# Patient Record
Sex: Male | Born: 1960 | Race: White | Hispanic: No | Marital: Single | Smoking: Never smoker
Health system: Southern US, Community
[De-identification: ages and names within clinical notes are randomized; demographics above are authoritative.]

## PROBLEM LIST (undated history)

## (undated) DIAGNOSIS — I519 Heart disease, unspecified: Secondary | ICD-10-CM

## (undated) DIAGNOSIS — E119 Type 2 diabetes mellitus without complications: Secondary | ICD-10-CM

## (undated) DIAGNOSIS — G4730 Sleep apnea, unspecified: Secondary | ICD-10-CM

## (undated) DIAGNOSIS — I2510 Atherosclerotic heart disease of native coronary artery without angina pectoris: Secondary | ICD-10-CM

## (undated) DIAGNOSIS — I219 Acute myocardial infarction, unspecified: Secondary | ICD-10-CM

## (undated) HISTORY — PX: KNEE ARTHROSCOPY W/ MENISCAL REPAIR: SHX1877

## (undated) HISTORY — PX: BICEPS TENDON REPAIR: SHX566

## (undated) HISTORY — DX: Sleep apnea, unspecified: G47.30

## (undated) HISTORY — DX: Heart disease, unspecified: I51.9

## (undated) HISTORY — PX: STENT PLACEMENT VASCULAR (ARMC HX): HXRAD1737

## (undated) HISTORY — DX: Type 2 diabetes mellitus without complications: E11.9

## (undated) HISTORY — PX: TRICEPS TENDON REPAIR: SHX2577

## (undated) HISTORY — PX: CORONARY ANGIOPLASTY: SHX604

---
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
## 1998-02-26 ENCOUNTER — Ambulatory Visit (HOSPITAL_BASED_OUTPATIENT_CLINIC_OR_DEPARTMENT_OTHER): Admission: RE | Admit: 1998-02-26 | Discharge: 1998-02-26 | Payer: Self-pay | Admitting: Orthopedic Surgery

## 2002-10-29 ENCOUNTER — Emergency Department (HOSPITAL_COMMUNITY): Admission: EM | Admit: 2002-10-29 | Discharge: 2002-10-29 | Payer: Self-pay

## 2004-06-25 ENCOUNTER — Ambulatory Visit: Payer: Self-pay | Admitting: Internal Medicine

## 2004-07-26 ENCOUNTER — Ambulatory Visit: Payer: Self-pay | Admitting: Internal Medicine

## 2004-08-09 ENCOUNTER — Inpatient Hospital Stay: Payer: Self-pay | Admitting: Internal Medicine

## 2004-08-25 ENCOUNTER — Ambulatory Visit: Payer: Self-pay | Admitting: Internal Medicine

## 2004-09-25 ENCOUNTER — Ambulatory Visit: Payer: Self-pay | Admitting: Internal Medicine

## 2004-10-15 ENCOUNTER — Emergency Department: Payer: Self-pay | Admitting: Emergency Medicine

## 2004-11-17 ENCOUNTER — Ambulatory Visit: Payer: Self-pay | Admitting: Internal Medicine

## 2004-11-23 ENCOUNTER — Ambulatory Visit: Payer: Self-pay | Admitting: Internal Medicine

## 2004-12-24 ENCOUNTER — Ambulatory Visit: Payer: Self-pay | Admitting: Internal Medicine

## 2005-01-23 ENCOUNTER — Ambulatory Visit: Payer: Self-pay | Admitting: Internal Medicine

## 2005-02-23 ENCOUNTER — Ambulatory Visit: Payer: Self-pay | Admitting: Internal Medicine

## 2005-03-03 ENCOUNTER — Ambulatory Visit: Payer: Self-pay | Admitting: Internal Medicine

## 2005-03-16 ENCOUNTER — Ambulatory Visit: Payer: Self-pay | Admitting: Internal Medicine

## 2006-05-04 ENCOUNTER — Ambulatory Visit: Payer: Self-pay | Admitting: General Surgery

## 2006-07-30 ENCOUNTER — Ambulatory Visit: Payer: Self-pay | Admitting: Internal Medicine

## 2006-08-25 ENCOUNTER — Ambulatory Visit: Payer: Self-pay | Admitting: Internal Medicine

## 2006-08-31 ENCOUNTER — Ambulatory Visit: Payer: Self-pay | Admitting: Internal Medicine

## 2007-03-26 ENCOUNTER — Ambulatory Visit: Payer: Self-pay | Admitting: Internal Medicine

## 2007-04-26 ENCOUNTER — Ambulatory Visit: Payer: Self-pay | Admitting: Internal Medicine

## 2007-06-26 ENCOUNTER — Ambulatory Visit: Payer: Self-pay | Admitting: Internal Medicine

## 2007-07-16 ENCOUNTER — Ambulatory Visit: Payer: Self-pay | Admitting: Internal Medicine

## 2007-07-27 ENCOUNTER — Ambulatory Visit: Payer: Self-pay | Admitting: Internal Medicine

## 2007-12-12 ENCOUNTER — Ambulatory Visit: Payer: Self-pay | Admitting: Internal Medicine

## 2007-12-19 ENCOUNTER — Ambulatory Visit: Payer: Self-pay | Admitting: Internal Medicine

## 2008-06-22 ENCOUNTER — Emergency Department (HOSPITAL_BASED_OUTPATIENT_CLINIC_OR_DEPARTMENT_OTHER): Admission: EM | Admit: 2008-06-22 | Discharge: 2008-06-22 | Payer: Self-pay | Admitting: Emergency Medicine

## 2009-01-07 ENCOUNTER — Ambulatory Visit: Payer: Self-pay | Admitting: Surgery

## 2009-01-11 ENCOUNTER — Ambulatory Visit: Payer: Self-pay | Admitting: Surgery

## 2009-04-16 ENCOUNTER — Ambulatory Visit: Payer: Self-pay | Admitting: Cardiology

## 2009-09-14 ENCOUNTER — Ambulatory Visit: Payer: Self-pay | Admitting: Cardiology

## 2010-01-31 ENCOUNTER — Ambulatory Visit: Payer: Self-pay | Admitting: Internal Medicine

## 2010-04-07 ENCOUNTER — Ambulatory Visit: Payer: Self-pay | Admitting: Specialist

## 2010-07-15 ENCOUNTER — Ambulatory Visit: Payer: Self-pay

## 2010-08-05 ENCOUNTER — Ambulatory Visit: Payer: Self-pay | Admitting: Unknown Physician Specialty

## 2010-08-12 ENCOUNTER — Ambulatory Visit: Payer: Self-pay | Admitting: Unknown Physician Specialty

## 2010-11-11 ENCOUNTER — Ambulatory Visit: Payer: Self-pay | Admitting: Cardiology

## 2011-06-26 LAB — GLUCOSE, CAPILLARY: Glucose-Capillary: 169 — ABNORMAL HIGH

## 2011-11-17 ENCOUNTER — Ambulatory Visit: Payer: Self-pay | Admitting: Unknown Physician Specialty

## 2012-02-02 ENCOUNTER — Ambulatory Visit: Payer: Self-pay | Admitting: Internal Medicine

## 2012-11-09 ENCOUNTER — Ambulatory Visit: Payer: Self-pay | Admitting: Unknown Physician Specialty

## 2013-01-03 ENCOUNTER — Ambulatory Visit: Payer: Self-pay | Admitting: Family Medicine

## 2013-08-18 LAB — CBC WITH DIFFERENTIAL/PLATELET
Basophil #: 0.1 10*3/uL (ref 0.0–0.1)
Basophil %: 1.3 %
Eosinophil #: 0.2 10*3/uL (ref 0.0–0.7)
HCT: 47.5 % (ref 40.0–52.0)
HGB: 16.4 g/dL (ref 13.0–18.0)
MCH: 31.3 pg (ref 26.0–34.0)
MCV: 91 fL (ref 80–100)
Monocyte %: 7.2 %
Neutrophil #: 7.3 10*3/uL — ABNORMAL HIGH (ref 1.4–6.5)
Neutrophil %: 68.4 %
WBC: 10.7 10*3/uL — ABNORMAL HIGH (ref 3.8–10.6)

## 2013-08-18 LAB — BASIC METABOLIC PANEL
BUN: 22 mg/dL — ABNORMAL HIGH (ref 7–18)
Co2: 22 mmol/L (ref 21–32)
Creatinine: 0.66 mg/dL (ref 0.60–1.30)
Sodium: 139 mmol/L (ref 136–145)

## 2013-08-18 LAB — TROPONIN I: Troponin-I: 0.1 ng/mL — ABNORMAL HIGH

## 2013-08-18 LAB — APTT: Activated PTT: 31.5 secs (ref 23.6–35.9)

## 2013-08-19 ENCOUNTER — Inpatient Hospital Stay: Payer: Self-pay | Admitting: Cardiology

## 2013-08-19 LAB — APTT: Activated PTT: 85.1 secs — ABNORMAL HIGH (ref 23.6–35.9)

## 2013-08-19 LAB — CK TOTAL AND CKMB (NOT AT ARMC)
CK, Total: 100 U/L (ref 35–232)
CK-MB: 9.9 ng/mL — ABNORMAL HIGH (ref 0.5–3.6)

## 2013-08-20 LAB — BASIC METABOLIC PANEL
Calcium, Total: 8.7 mg/dL (ref 8.5–10.1)
Co2: 24 mmol/L (ref 21–32)
Creatinine: 0.71 mg/dL (ref 0.60–1.30)
EGFR (African American): >60
Glucose: 111 mg/dL — ABNORMAL HIGH (ref 65–99)
Potassium: 4.1 mmol/L (ref 3.5–5.1)
Sodium: 138 mmol/L (ref 136–145)

## 2013-08-20 LAB — APTT: Activated PTT: 30.8 secs (ref 23.6–35.9)

## 2014-08-11 ENCOUNTER — Observation Stay: Payer: Self-pay | Admitting: Cardiology

## 2014-08-11 DIAGNOSIS — I2510 Atherosclerotic heart disease of native coronary artery without angina pectoris: Secondary | ICD-10-CM | POA: Insufficient documentation

## 2014-08-11 DIAGNOSIS — F419 Anxiety disorder, unspecified: Secondary | ICD-10-CM | POA: Insufficient documentation

## 2014-08-11 LAB — TROPONIN I: Troponin-I: <0.02 ng/mL

## 2014-08-11 LAB — CBC WITH DIFFERENTIAL/PLATELET
BASOS ABS: 0.1 10*3/uL (ref 0.0–0.1)
BASOS PCT: 0.9 %
Eosinophil #: 0.1 10*3/uL (ref 0.0–0.7)
Eosinophil %: 1.2 %
HCT: 49.8 % (ref 40.0–52.0)
HGB: 16.4 g/dL (ref 13.0–18.0)
Lymphocyte #: 2.3 10*3/uL (ref 1.0–3.6)
Lymphocyte %: 23.1 %
MCH: 30.7 pg (ref 26.0–34.0)
MCHC: 33 g/dL (ref 32.0–36.0)
MCV: 93 fL (ref 80–100)
Monocyte #: 0.6 x10 3/mm (ref 0.2–1.0)
Monocyte %: 6.3 %
NEUTROS ABS: 6.9 10*3/uL — AB (ref 1.4–6.5)
Neutrophil %: 68.5 %
Platelet: 249 10*3/uL (ref 150–440)
RBC: 5.34 10*6/uL (ref 4.40–5.90)
RDW: 13.4 % (ref 11.5–14.5)
WBC: 10.1 10*3/uL (ref 3.8–10.6)

## 2014-08-11 LAB — BASIC METABOLIC PANEL
Anion Gap: 5 — ABNORMAL LOW (ref 7–16)
BUN: 20 mg/dL — AB (ref 7–18)
Calcium, Total: 8.8 mg/dL (ref 8.5–10.1)
Chloride: 105 mmol/L (ref 98–107)
Co2: 26 mmol/L (ref 21–32)
Creatinine: 0.76 mg/dL (ref 0.60–1.30)
EGFR (African American): >60
EGFR (Non-African Amer.): >60
GLUCOSE: 194 mg/dL — AB (ref 65–99)
Osmolality: 280 (ref 275–301)
POTASSIUM: 3.8 mmol/L (ref 3.5–5.1)
Sodium: 136 mmol/L (ref 136–145)

## 2014-08-11 LAB — APTT: Activated PTT: 28.4 secs (ref 23.6–35.9)

## 2014-08-11 LAB — PROTIME-INR
INR: 1
Prothrombin Time: 13.1 secs (ref 11.5–14.7)

## 2014-08-11 LAB — HEPARIN LEVEL (UNFRACTIONATED): ANTI-XA(UNFRACTIONATED): 0.23 [IU]/mL — AB (ref 0.30–0.70)

## 2014-08-12 LAB — CBC WITH DIFFERENTIAL/PLATELET
BASOS ABS: 0.1 10*3/uL (ref 0.0–0.1)
BASOS PCT: 1.3 %
EOS ABS: 0.2 10*3/uL (ref 0.0–0.7)
Eosinophil %: 2.2 %
HCT: 47 % (ref 40.0–52.0)
HGB: 15.4 g/dL (ref 13.0–18.0)
Lymphocyte #: 2.2 10*3/uL (ref 1.0–3.6)
Lymphocyte %: 22.6 %
MCH: 30.8 pg (ref 26.0–34.0)
MCHC: 32.8 g/dL (ref 32.0–36.0)
MCV: 94 fL (ref 80–100)
MONO ABS: 0.5 x10 3/mm (ref 0.2–1.0)
Monocyte %: 5.6 %
Neutrophil #: 6.5 10*3/uL (ref 1.4–6.5)
Neutrophil %: 68.3 %
Platelet: 234 10*3/uL (ref 150–440)
RBC: 5.02 10*6/uL (ref 4.40–5.90)
RDW: 13.6 % (ref 11.5–14.5)
WBC: 9.5 10*3/uL (ref 3.8–10.6)

## 2014-08-12 LAB — BASIC METABOLIC PANEL
ANION GAP: 5 — AB (ref 7–16)
BUN: 21 mg/dL — ABNORMAL HIGH (ref 7–18)
CALCIUM: 8.3 mg/dL — AB (ref 8.5–10.1)
CHLORIDE: 110 mmol/L — AB (ref 98–107)
Co2: 24 mmol/L (ref 21–32)
Creatinine: 0.69 mg/dL (ref 0.60–1.30)
EGFR (Non-African Amer.): >60
GLUCOSE: 181 mg/dL — AB (ref 65–99)
Osmolality: 285 (ref 275–301)
Potassium: 4.2 mmol/L (ref 3.5–5.1)
Sodium: 139 mmol/L (ref 136–145)

## 2014-08-12 LAB — HEPARIN LEVEL (UNFRACTIONATED): ANTI-XA(UNFRACTIONATED): 0.42 [IU]/mL (ref 0.30–0.70)

## 2015-01-15 NOTE — Discharge Summary (Signed)
Dates of Admission and Diagnosis:  Date of Admission 18-Aug-2013   Date of Discharge 20-Aug-2013   Admitting Diagnosis unstable angina   Final Diagnosis non st elevation mi   Discharge Diagnosis 1 non st elevation mi   2 diabetes melitus   3 hyperlipidemia   4 acute coronary syndrome    Chief Complaint/History of Present Illness chest pain at rest similar to his angina. ruled in for nstemi with troponin of 0.14. 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 Less than 10% stenosis in proximal rca at previous stent, less than 10% stenosis in left circumflex; less than 10% stenosis in proximal lad at previous stent site. EF 50%. Underwent pci of distal rca lesion which had timi 3 flow.   Hospital Course:  Hospital Course Has done well post pci. No evidence of complications form cath . Renal funciton unchanged. No chest pain. Ambulating well. Tolerated plavix and beta blockers as well as ace i and statin and asa   Condition on Discharge Good   DISCHARGE INSTRUCTIONS HOME MEDS:  Medication Reconciliation: Patient's Home Medications at Discharge:     Medication Instructions  crestor 10 mg tablet  1 tab(s) orally once a day (at bedtime) x 30 days    nitrostat 0.4 mg tablet  1 tab(s) sublingual every 5 minutes    advil 200 mg tablet  2 tab(s) orally every 4 hours    lisinopril 5 mg oral tablet  1 tab(s) orally once a day   onglyza 5 mg oral tablet  1 tab(s) orally once a day   meloxicam 15 mg oral tablet  1 tab(s) orally once a day   farxiga 10 mg oral tablet  1 tab(s) orally once a day   lantus  15  subcutaneous 2 times a day (before meals)   actos 45 mg oral tablet  1 tab(s) orally once a day   novolog 100 units/ml subcutaneous solution  12  subcutaneous once (at bedtime)   clopidogrel 75 mg oral tablet  1 tab(s) orally once a day   clopidogrel 75 mg oral tablet  1 tab(s) orally once a day   metoprolol succinate 25 mg oral  tablet, extended release  1 tab(s) orally once a day    PRESCRIPTIONS: PRINTED AND GIVEN TO PATIENT/FAMILY   Physician's Instructions:  Home Health? No   Treatments None   Dressing Care Remove dressing tomorrow.  May shower.   Home Oxygen? No   Diet Low Fat, Low Cholesterol   Dietary Supplements None   Diet Consistency Regular Consistency   Activity Limitations No exertional activity  No heavy lifting   Return to Work after follow up visit with MD   Time frame for Follow Up Appointment 1-2 weeks   Electronic Signatures: Teodoro Spray (MD)  (Signed [phone] 08:19)  Authored: ADMISSION DATE AND DIAGNOSIS, CHIEF COMPLAINT/HPI, HOSPITAL COURSE, DISCHARGE INSTRUCTIONS HOME MEDS, PATIENT INSTRUCTIONS   Last Updated: 26-Nov-14 08:19 by Teodoro Spray (MD)

## 2016-06-05 ENCOUNTER — Ambulatory Visit: Payer: Self-pay

## 2017-01-16 ENCOUNTER — Ambulatory Visit: Payer: Self-pay | Admitting: Oncology

## 2017-02-15 ENCOUNTER — Other Ambulatory Visit: Payer: Self-pay | Admitting: *Deleted

## 2017-02-15 ENCOUNTER — Inpatient Hospital Stay: Payer: BLUE CROSS/BLUE SHIELD

## 2017-02-15 ENCOUNTER — Encounter (INDEPENDENT_AMBULATORY_CARE_PROVIDER_SITE_OTHER): Payer: Self-pay

## 2017-02-15 ENCOUNTER — Inpatient Hospital Stay: Payer: BLUE CROSS/BLUE SHIELD | Attending: Oncology | Admitting: Oncology

## 2017-02-15 ENCOUNTER — Encounter: Payer: Self-pay | Admitting: Oncology

## 2017-02-15 VITALS — BP 129/77 | HR 79 | Temp 97.5°F | Wt 199.7 lb

## 2017-02-15 VITALS — BP 116/69 | HR 65 | Resp 20

## 2017-02-15 DIAGNOSIS — R634 Abnormal weight loss: Secondary | ICD-10-CM | POA: Diagnosis not present

## 2017-02-15 DIAGNOSIS — F419 Anxiety disorder, unspecified: Secondary | ICD-10-CM | POA: Insufficient documentation

## 2017-02-15 DIAGNOSIS — E119 Type 2 diabetes mellitus without complications: Secondary | ICD-10-CM | POA: Insufficient documentation

## 2017-02-15 DIAGNOSIS — M1990 Unspecified osteoarthritis, unspecified site: Secondary | ICD-10-CM | POA: Diagnosis not present

## 2017-02-15 DIAGNOSIS — E291 Testicular hypofunction: Secondary | ICD-10-CM | POA: Diagnosis not present

## 2017-02-15 DIAGNOSIS — R5381 Other malaise: Secondary | ICD-10-CM | POA: Diagnosis not present

## 2017-02-15 DIAGNOSIS — D751 Secondary polycythemia: Secondary | ICD-10-CM

## 2017-02-15 DIAGNOSIS — Z563 Stressful work schedule: Secondary | ICD-10-CM | POA: Diagnosis not present

## 2017-02-15 DIAGNOSIS — Z87891 Personal history of nicotine dependence: Secondary | ICD-10-CM | POA: Diagnosis not present

## 2017-02-15 DIAGNOSIS — R5383 Other fatigue: Secondary | ICD-10-CM | POA: Insufficient documentation

## 2017-02-15 DIAGNOSIS — Z79899 Other long term (current) drug therapy: Secondary | ICD-10-CM

## 2017-02-15 DIAGNOSIS — I2510 Atherosclerotic heart disease of native coronary artery without angina pectoris: Secondary | ICD-10-CM | POA: Diagnosis not present

## 2017-02-15 DIAGNOSIS — D45 Polycythemia vera: Secondary | ICD-10-CM

## 2017-02-15 LAB — CBC WITH DIFFERENTIAL/PLATELET
BASOS PCT: 1 %
Basophils Absolute: 0.1 10*3/uL (ref 0–0.1)
EOS ABS: 0.6 10*3/uL (ref 0–0.7)
Eosinophils Relative: 7 %
HEMATOCRIT: 56.9 % — AB (ref 40.0–52.0)
HEMOGLOBIN: 19.9 g/dL — AB (ref 13.0–18.0)
Lymphocytes Relative: 25 %
Lymphs Abs: 2.1 10*3/uL (ref 1.0–3.6)
MCH: 31.8 pg (ref 26.0–34.0)
MCHC: 34.9 g/dL (ref 32.0–36.0)
MCV: 91.2 fL (ref 80.0–100.0)
Monocytes Absolute: 0.7 10*3/uL (ref 0.2–1.0)
Monocytes Relative: 9 %
NEUTROS ABS: 4.9 10*3/uL (ref 1.4–6.5)
NEUTROS PCT: 58 %
Platelets: 223 10*3/uL (ref 150–440)
RBC: 6.24 MIL/uL — AB (ref 4.40–5.90)
RDW: 14 % (ref 11.5–14.5)
WBC: 8.4 10*3/uL (ref 3.8–10.6)

## 2017-02-15 LAB — URINALYSIS, COMPLETE (UACMP) WITH MICROSCOPIC
BACTERIA UA: NONE SEEN
Bilirubin Urine: NEGATIVE
Glucose, UA: >=500 mg/dL — AB
Hgb urine dipstick: NEGATIVE
Ketones, ur: 5 mg/dL — AB
LEUKOCYTES UA: NEGATIVE
NITRITE: NEGATIVE
PH: 6 (ref 5.0–8.0)
Protein, ur: NEGATIVE mg/dL
RBC / HPF: NONE SEEN RBC/hpf (ref 0–5)
SPECIFIC GRAVITY, URINE: 1.017 (ref 1.005–1.030)
Squamous Epithelial / LPF: NONE SEEN

## 2017-02-15 NOTE — Patient Instructions (Signed)
Therapeutic Phlebotomy Discharge Instructions  - Increase your fluid intake over the next 4 hours  - No smoking for 30 minutes  - Avoid using the affected arm (the one you had the blood drawn from) for heavy lifting or other activities.  - You may resume all normal activities after 30 minutes.  You are to notify the office if you experience:   - Persistent dizziness and/or lightheadedness -Uncontrolled or excessive bleeding at the site.    Therapeutic Phlebotomy Therapeutic phlebotomy is the controlled removal of blood from a person's body for the purpose of treating a medical condition. The procedure is similar to donating blood. Usually, about a pint (470 mL, or 0.47L) of blood is removed. The average adult has 9-12 pints (4.3-5.7 L) of blood. Therapeutic phlebotomy may be used to treat the following medical conditions:  Hemochromatosis. This is a condition in which the blood contains too much iron.  Polycythemia vera. This is a condition in which the blood contains too many red blood cells.  Porphyria cutanea tarda. This is a disease in which an important part of hemoglobin is not made properly. It results in the buildup of abnormal amounts of porphyrins in the body.  Sickle cell disease. This is a condition in which the red blood cells form an abnormal crescent shape rather than a round shape. Tell a health care provider about:  Any allergies you have.  All medicines you are taking, including vitamins, herbs, eye drops, creams, and over-the-counter medicines.  Any problems you or family members have had with anesthetic medicines.  Any blood disorders you have.  Any surgeries you have had.  Any medical conditions you have. What are the risks? Generally, this is a safe procedure. However, problems may occur, including:  Nausea or light-headedness.  Low blood pressure.  Soreness, bleeding, swelling, or bruising at the needle insertion site.  Infection. What happens  before the procedure?  Follow instructions from your health care provider about eating or drinking restrictions.  Ask your health care provider about changing or stopping your regular medicines. This is especially important if you are taking diabetes medicines or blood thinners.  Wear clothing with sleeves that can be raised above the elbow.  Plan to have someone take you home after the procedure.  You may have a blood sample taken. What happens during the procedure?  A needle will be inserted into one of your veins.  Tubing and a collection bag will be attached to that needle.  Blood will flow through the needle and tubing into the collection bag.  You may be asked to open and close your hand slowly and continually during the entire collection.  After the specified amount of blood has been removed from your body, the collection bag and tubing will be clamped.  The needle will be removed from your vein.  Pressure will be held on the site of the needle insertion to stop the bleeding.  A bandage (dressing) will be placed over the needle insertion site. The procedure may vary among health care providers and hospitals. What happens after the procedure?  Your recovery will be assessed and monitored.  You can return to your normal activities as directed by your health care provider. This information is not intended to replace advice given to you by your health care provider. Make sure you discuss any questions you have with your health care provider. Document Released: 02/13/2011 Document Revised: 05/13/2016 Document Reviewed: 09/07/2014 Elsevier Interactive Patient Education  2017 [employer].  Therapeutic Phlebotomy, Care After Refer to this sheet in the next few weeks. These instructions provide you with information about caring for yourself after your procedure. Your health care provider may also give you more specific instructions. Your treatment has been planned according to  current medical practices, but problems sometimes occur. Call your health care provider if you have any problems or questions after your procedure. What can I expect after the procedure? After the procedure, it is common to have:  Light-headedness or dizziness. You may feel faint.  Nausea.  Tiredness. Follow these instructions at home: Activity   Return to your normal activities as directed by your health care provider. Most people can go back to their normal activities right away.  Avoid strenuous physical activity and heavy lifting or pulling for about 5 hours after the procedure. Do not lift anything that is heavier than 10 lb (4.5 kg).  Athletes should avoid strenuous exercise for at least 12 hours.  Change positions slowly for the remainder of the day. This will help to prevent light-headedness or fainting.  If you feel light-headed, lie down until the feeling goes away. Eating and drinking   Be sure to eat well-balanced meals for the next 24 hours.  Drink enough fluid to keep your urine clear or pale yellow.  Avoid drinking alcohol on the day that you had the procedure. Care of the Needle Insertion Site   Keep your bandage dry. You can remove the bandage after about 5 hours or as directed by your health care provider.  If you have bleeding from the needle insertion site, elevate your arm and press firmly on the site until the bleeding stops.  If you have bruising at the site, apply ice to the area:  Put ice in a plastic bag.  Place a towel between your skin and the bag.  Leave the ice on for 20 minutes, 2-3 times a day for the first 24 hours.  If the swelling does not go away after 24 hours, apply a warm, moist washcloth to the area for 20 minutes, 2-3 times a day. General instructions   Avoid smoking for at least 30 minutes after the procedure.  Keep all follow-up visits as directed by your health care provider. It is important to continue with further  therapeutic phlebotomy treatments as directed. Contact a health care provider if:  You have redness, swelling, or pain at the needle insertion site.  You have fluid, blood, or pus coming from the needle insertion site.  You feel light-headed, dizzy, or nauseated, and the feeling does not go away.  You notice new bruising at the needle insertion site.  You feel weaker than normal.  You have a fever or chills. Get help right away if:  You have severe nausea or vomiting.  You have chest pain.  You have trouble breathing. This information is not intended to replace advice given to you by your health care provider. Make sure you discuss any questions you have with your health care provider. Document Released: 02/13/2011 Document Revised: 05/13/2016 Document Reviewed: 09/07/2014 Elsevier Interactive Patient Education  2017 [employer].

## 2017-02-16 ENCOUNTER — Encounter: Payer: Self-pay | Admitting: Oncology

## 2017-02-16 NOTE — Progress Notes (Signed)
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 Erlene Quan back in 2008 for polycythemia. I do not have those records at this time. He has been referred to [place] for polycythemia. Recent CBC from 12/14/2016 showed white count of 10.9, H&H of 19.8/excision on with a platelet count of 207. CMP was significant for a blood sugar of 188. TSH was within normal limits. Total testosterone was significantly elevated at 1176.  Trend of his cbc is as follows:  Component     Latest Ref Rng & Units 08/18/2013 08/11/2014 08/12/2014 02/15/2017  WBC     3.8 - 10.6 K/uL 10.7 (H) 10.1 9.5 8.4  RBC     4.40 - 5.90 MIL/uL 5.22 5.34 5.02 6.24 (H)  HGB     13.0 - 18.0 g/dL 16.4 16.4 15.4   HCT     40.0 - 52.0 % 47.5 49.8 47.0   MCV     80.0 - 100.0 fL 91 93 94 91.2  MCH     26.0 - 34.0 pg 31.3 30.7 30.8 31.8  MCHC     32.0 - 36.0 g/dL 34.4 33.0 32.8 34.9  RDW     11.5 - 14.5 % 13.7 13.4 13.6 14.0  Platelets     150 - 440 K/uL 241 249 234 223  Neutrophils     % 68.4 68.5 68.3 58  Lymphocytes     % 21.5 23.1 22.6 25  Monocytes Relative     % 7.2 6.3 5.6 9  Eosinophil     % 1.6 1.2 2.2 7  Basophil     % 1.3 0.9 1.3 1  NEUT#     1.4 - 6.5 K/uL 7.3 (H) 6.9 (H) 6.5 4.9  Lymphocyte #     1.0 - 3.6 K/uL 2.3 2.3 2.2 2.1  Monocyte #     0.2 - 1.0 K/uL 0.8 0.6 0.5 0.7  Eosinophils Absolute     0 - 0.7 K/uL 0.2 0.1 0.2 0.6  Basophils Absolute     0 - 0.1 K/uL 0.1 0.1 0.1 0.1  Hemoglobin     13.0 - 18.0 g/dL    19.9 (H)  HCT     40.0 - 52.0 %    56.9 (H)    Patient states that  he has possible hypogonadism (although not formally diagnosed) and report significant fatigue when he is not on testosterone. He has never seen a urologist for the same.  he has been injecting himself testosterone shots mainly for his fatigue and building muscle. Denies any headaches or lightheadedness chest pain or strokelike symptoms. He has had coronary disease in the past and has had stent placements as well. Admits some unintentional weight loss but also reports being under stress at work. He thinks he has elevated H/H even when he was not on testosterone supplements. He has h/o OSA. Has smoked occasional cigars in the past. No known chronic lung disease  ECOG PS-  0  Pain scale- 0   Review of systems- Review of Systems  Constitutional: Positive for malaise/fatigue. Negative for chills, fever and weight loss.  HENT: Negative for congestion, ear discharge and nosebleeds.   Eyes: Negative for blurred vision.  Respiratory: Negative for cough, hemoptysis, sputum production, shortness of breath and wheezing.   Cardiovascular: Negative for chest pain, palpitations, orthopnea and claudication.  Gastrointestinal: Negative for abdominal pain, blood in stool, constipation, diarrhea, heartburn, melena, nausea and vomiting.  Genitourinary: Negative for dysuria, flank pain, frequency, hematuria and urgency.  Musculoskeletal: Negative for back pain, joint pain and myalgias.  Skin: Negative for rash.  Neurological: Negative for dizziness, tingling, focal weakness, seizures, weakness and headaches.  Endo/Heme/Allergies: Does not bruise/bleed easily.  Psychiatric/Behavioral: Negative for depression and suicidal ideas. The patient does not have insomnia.     Not on File  Patient Active Problem List   Diagnosis Date Noted  . 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 Anxiety 08/11/2014  . Coronary artery disease 08/11/2014     Past Medical History:  Diagnosis Date  . Diabetes mellitus without  complication ([place])   . Heart disease   . Sleep apnea in adult      Past Surgical History:  Procedure Laterality Date  . STENT PLACEMENT VASCULAR ([place] HX)      Social History   Social History  . Marital status: Married    Spouse name: N/A  . Number of children: N/A  . Years of education: N/A   Occupational History  . Not on file.   Social History Main Topics  . Smoking status: Not on file  . Smokeless tobacco: Not on file  . Alcohol use Not on file  . Drug use: Unknown  . Sexual activity: Not on file   Other Topics Concern  . Not on file   Social History Narrative  . No narrative on file     No family history on file.   Current Outpatient Prescriptions:  .  REPATHA SURECLICK 469 MG/ML SOAJ, 140 mg., Disp: , Rfl:    Physical exam:  Vitals:   02/15/17 1511  BP: 129/77  Pulse: 79  Temp: 97.5 F (36.4 C)  Weight: 199 lb 11.2 oz (90.6 kg)   Physical Exam  Constitutional: He is oriented to person, place, and time and well-developed, well-nourished, and in no distress.  General appearance is ruddy. Appears well built  HENT:  Head: Normocephalic and atraumatic.  Eyes: EOM are normal. Pupils are equal, round, and reactive to light.  Neck: Normal range of motion.  Cardiovascular: Normal rate, regular rhythm and normal heart sounds.   Pulmonary/Chest: Effort normal and breath sounds normal.  Abdominal: Soft. Bowel sounds are normal.  Neurological: He is alert and oriented to person, place, and time.  Skin: Skin is warm and dry.       CMP Latest Ref Rng & Units 08/12/2014  Glucose 65 - 99 mg/dL 181(H)  BUN 7 - 18 mg/dL 21(H)  Creatinine 0.60 - 1.30 mg/dL 0.69  Sodium 136 - 145 mmol/L 139  Potassium 3.5 - 5.1 mmol/L 4.2  Chloride 98 - 107 mmol/L 110(H)  CO2 21 - 32 mmol/L 24  Calcium 8.5 - 10.1 mg/dL 8.3(L)   CBC Latest Ref Rng & Units 02/15/2017  WBC 3.8 - 10.6 K/uL 8.4  Hemoglobin 13.0 - 18.0 g/dL 19.9(H)  Hematocrit 40.0 - 52.0 % 56.9(H)    Platelets 150 - 440 K/uL 223    Assessment and plan- Patient is a 56 y.o. 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 I have strongly recommended that patient should stop injecting himself testosterone immediately. At such high levels of H&H patient is at increased risk of strokes and heart attacks. I would not recommend continuing testosterone use and phlebotomy to decrease his H&H. He needs to be formally evaluated by urology for hypogonadism and if he does have it, lowest dose of testosterone should be initiated to alleviate his symptoms. If H/H remains elevated despite low does testosterone and if he is unable to come off testosterone due to symtpoms then intermittent phlebotomy could be considered.  I will check JAK2 mutation and UA given that patient states he has had elevated H/H despite not using testosterone. although levels from 2014 and 2015 show that his hb has been <16.5. His OSA could also be partly contributing to his polycythemia. I will plan for phlebotomy 250 cc Q2 weeks X 2. I will see him back in 1 month. I will also speak to Threasa Alpha FNP regarding his self testosterone use which I strongly discourage. I will also see his prior work up from 2008 by Dr. Ma Hillock   Thank you for this kind referral and the opportunity to participate in the care of this patient   Visit Diagnosis 1. Polycythemia, secondary     Dr. Randa Evens, MD, MPH [facility] Pager- [phone] 02/16/2017

## 2017-02-23 LAB — JAK2 EXONS 12-15

## 2017-02-23 LAB — JAK2  V617F QUAL. WITH REFLEX TO EXON 12: Reflex:: 15

## 2017-03-01 ENCOUNTER — Other Ambulatory Visit: Payer: BLUE CROSS/BLUE SHIELD

## 2017-03-07 ENCOUNTER — Ambulatory Visit
Admission: RE | Admit: 2017-03-07 | Discharge: 2017-03-07 | Disposition: A | Discharge status: Home / Self Care | Payer: BLUE CROSS/BLUE SHIELD | Source: Ambulatory Visit | Attending: Oncology | Admitting: Oncology

## 2017-03-07 DIAGNOSIS — D751 Secondary polycythemia: Secondary | ICD-10-CM | POA: Insufficient documentation

## 2017-03-09 ENCOUNTER — Inpatient Hospital Stay: Payer: BLUE CROSS/BLUE SHIELD | Attending: Oncology

## 2017-03-09 ENCOUNTER — Other Ambulatory Visit: Payer: Self-pay

## 2017-03-09 ENCOUNTER — Inpatient Hospital Stay: Payer: BLUE CROSS/BLUE SHIELD

## 2017-03-09 DIAGNOSIS — E119 Type 2 diabetes mellitus without complications: Secondary | ICD-10-CM | POA: Insufficient documentation

## 2017-03-09 DIAGNOSIS — E291 Testicular hypofunction: Secondary | ICD-10-CM | POA: Diagnosis not present

## 2017-03-09 DIAGNOSIS — R5383 Other fatigue: Secondary | ICD-10-CM | POA: Diagnosis not present

## 2017-03-09 DIAGNOSIS — D751 Secondary polycythemia: Secondary | ICD-10-CM | POA: Diagnosis present

## 2017-03-09 DIAGNOSIS — Z566 Other physical and mental strain related to work: Secondary | ICD-10-CM | POA: Insufficient documentation

## 2017-03-09 DIAGNOSIS — G4733 Obstructive sleep apnea (adult) (pediatric): Secondary | ICD-10-CM | POA: Diagnosis not present

## 2017-03-09 DIAGNOSIS — Z87891 Personal history of nicotine dependence: Secondary | ICD-10-CM | POA: Insufficient documentation

## 2017-03-09 DIAGNOSIS — Z79899 Other long term (current) drug therapy: Secondary | ICD-10-CM | POA: Diagnosis not present

## 2017-03-09 LAB — HEMOGLOBIN: Hemoglobin: 18.6 g/dL — ABNORMAL HIGH (ref 13.0–18.0)

## 2017-03-09 NOTE — Progress Notes (Signed)
PerDr Janese Banks proceed with phlebotomy today and will follow up in 2 weeks with pt.

## 2017-03-15 ENCOUNTER — Inpatient Hospital Stay: Payer: BLUE CROSS/BLUE SHIELD | Admitting: Oncology

## 2017-03-15 ENCOUNTER — Inpatient Hospital Stay: Payer: BLUE CROSS/BLUE SHIELD

## 2017-03-23 ENCOUNTER — Encounter: Payer: Self-pay | Admitting: Oncology

## 2017-03-23 ENCOUNTER — Inpatient Hospital Stay: Payer: BLUE CROSS/BLUE SHIELD

## 2017-03-23 ENCOUNTER — Inpatient Hospital Stay (HOSPITAL_BASED_OUTPATIENT_CLINIC_OR_DEPARTMENT_OTHER): Payer: BLUE CROSS/BLUE SHIELD | Admitting: Oncology

## 2017-03-23 VITALS — BP 131/77 | HR 73 | Temp 97.0°F | Resp 20

## 2017-03-23 VITALS — BP 121/79 | HR 66 | Temp 97.5°F | Resp 20 | Wt 194.3 lb

## 2017-03-23 DIAGNOSIS — E291 Testicular hypofunction: Secondary | ICD-10-CM | POA: Diagnosis not present

## 2017-03-23 DIAGNOSIS — D751 Secondary polycythemia: Secondary | ICD-10-CM

## 2017-03-23 DIAGNOSIS — Z566 Other physical and mental strain related to work: Secondary | ICD-10-CM

## 2017-03-23 DIAGNOSIS — E119 Type 2 diabetes mellitus without complications: Secondary | ICD-10-CM

## 2017-03-23 DIAGNOSIS — R5383 Other fatigue: Secondary | ICD-10-CM | POA: Diagnosis not present

## 2017-03-23 DIAGNOSIS — Z87891 Personal history of nicotine dependence: Secondary | ICD-10-CM | POA: Diagnosis not present

## 2017-03-23 DIAGNOSIS — Z79899 Other long term (current) drug therapy: Secondary | ICD-10-CM

## 2017-03-23 DIAGNOSIS — G4733 Obstructive sleep apnea (adult) (pediatric): Secondary | ICD-10-CM

## 2017-03-23 LAB — CBC
HEMATOCRIT: 50.8 % (ref 39.0–52.0)
HEMOGLOBIN: 18.1 g/dL — AB (ref 13.0–17.0)
MCH: 32.2 pg (ref 26.0–34.0)
MCHC: 35.6 g/dL (ref 32.0–36.0)
MCV: 90.3 fL (ref 80.0–100.0)
Platelets: 244 10*3/uL (ref 150–440)
RBC: 5.62 MIL/uL (ref 4.40–5.90)
RDW: 13.9 % (ref 11.5–14.5)
WBC: 9 10*3/uL (ref 3.8–10.6)

## 2017-03-23 NOTE — Progress Notes (Signed)
Hematology/Oncology Consult note 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 Erlene Quan back in 2008 for polycythemia. I do not have those records at this time. He has been referred to [place] for polycythemia. Recent CBC from 12/14/2016 showed white count of 10.9, H&H of 19.8/excision on with a platelet count of 207. CMP was significant for a blood sugar of 188. TSH was within normal limits. Total testosterone was significantly elevated at 1176. Patient noted to have a hemoglobin of 15-16 between 2014 in 2015 and was found to have a hemoglobin of 19.9 on 02/15/2017  Patient states that he has possible hypogonadism (although not formally diagnosed) and report significant fatigue when he is not on testosterone. He has never seen a urologist for the same.  he has been injecting himself testosterone shots mainly for his fatigue and building muscle. Denies any headaches or lightheadedness chest pain or strokelike symptoms. He has had coronary disease in the past and has had stent placements as well. Admits some unintentional weight loss but also reports being under stress at work. He thinks he has elevated H/H even when he was not on testosterone supplements. He has h/o OSA. Has smoked occasional cigars in the past. No known chronic lung disease  Further workup on 02/15/2017 revealed no evidence of jak 2 grays on 12 mutation. Urinalysis is negative for hematuria. Chest x-ray showed coarse interstitial markings. No acute disease  Interval history- patient states that he has stopped doing his testosterone shots since his last visit. He  refuses to tell me who has been prescribing him testosterone injections or how he has been getting testosterone to inject himself  ECOG PS- 0 Pain scale- 0  Review of systems- Review of Systems  Constitutional: Negative for chills, fever, malaise/fatigue and weight loss.  HENT: Negative for congestion, ear discharge and nosebleeds.   Eyes: Negative for blurred vision.  Respiratory: Negative for cough, hemoptysis, sputum production, shortness of breath and wheezing.   Cardiovascular: Negative for chest pain, palpitations, orthopnea and claudication.  Gastrointestinal: Negative for abdominal pain, blood in stool, constipation, diarrhea, heartburn, melena, nausea and vomiting.  Genitourinary: Negative for dysuria, flank pain, frequency, hematuria and urgency.  Musculoskeletal: Negative for back pain, joint pain and myalgias.  Skin: Negative for rash.  Neurological: Negative for dizziness, tingling, focal weakness, seizures, weakness and headaches.  Endo/Heme/Allergies: Does not bruise/bleed easily.  Psychiatric/Behavioral: Negative for depression and suicidal ideas. The patient does not have insomnia.       Not on File   Past Medical History:  Diagnosis Date  . Diabetes mellitus without complication ([place])   . Heart disease   . Sleep apnea in adult      Past Surgical History:  Procedure Laterality Date  . STENT PLACEMENT VASCULAR ([place] HX)      Social History   Social History  . Marital status: Married    Spouse name: N/A  . Number of children: N/A  . Years of education: N/A   Occupational History  . Not on file.   Social History Main Topics  . Smoking status: Former Smoker    Types: Cigars  . Smokeless  tobacco: Never Used     Comment: occasional cigars  . Alcohol use Not on file  . Drug use: Unknown  . Sexual activity: Not on file   Other Topics Concern  . Not on file   Social History Narrative  . No narrative on file    No family history on  file.   Current Outpatient Prescriptions:  .  REPATHA SURECLICK 629 MG/ML SOAJ, 140 mg., Disp: , Rfl:   Physical exam:  Vitals:   03/23/17 1112  BP: 121/79  Pulse: 66  Resp: 20  Temp: 97.5 F (36.4 C)  TempSrc: Tympanic  Weight: 194 lb 5 oz (88.1 kg)   Physical Exam  Constitutional: He is oriented to person, place, and time and well-developed, well-nourished, and in no distress.  HENT:  Head: Normocephalic and atraumatic.  Eyes: EOM are normal. Pupils are equal, round, and reactive to light.  Neck: Normal range of motion.  Cardiovascular: Normal rate, regular rhythm and normal heart sounds.   Pulmonary/Chest: Effort normal and breath sounds normal.  Abdominal: Soft. Bowel sounds are normal.  Neurological: He is alert and oriented to person, place, and time.  Skin: Skin is warm and dry.     CMP Latest Ref Rng & Units 08/12/2014  Glucose 65 - 99 mg/dL 181(H)  BUN 7 - 18 mg/dL 21(H)  Creatinine 0.60 - 1.30 mg/dL 0.69  Sodium 136 - 145 mmol/L 139  Potassium 3.5 - 5.1 mmol/L 4.2  Chloride 98 - 107 mmol/L 110(H)  CO2 21 - 32 mmol/L 24  Calcium 8.5 - 10.1 mg/dL 8.3(L)   CBC Latest Ref Rng & Units 03/23/2017  WBC 3.8 - 10.6 K/uL 9.0  Hemoglobin 13.0 - 18.0 g/dL 18.1(H)  Hematocrit 40.0 - 52.0 % 50.8  Platelets 150 - 440 K/uL 244    No images are attached to the encounter.  Dg Chest 2 View  Result Date: 03/07/2017 CLINICAL DATA:  Polycythemia. Coronary artery disease, diabetes, former smoker. EXAM: CHEST  2 VIEW COMPARISON:  CT scan of the chest of January 03, 2013 FINDINGS: The lungs are adequately inflated. There is no focal infiltrate. The interstitial markings are coarse. The heart and pulmonary vascularity are normal. The mediastinum is normal in width. There is no pleural effusion. The bony thorax exhibits no acute abnormality. IMPRESSION: Coarse lung markings may reflect the patient's smoking history. There is no pneumonia nor CHF. Electronically Signed   By: David   [place] M.D.   On: 03/07/2017 09:22     Assessment and plan- Patient is a 56 y.o. 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 Diagnosis of hypogonadism should be formally made by urology or pcp if he indeed has it. They should be pescribing it in a controlled manner if he indeed has hypogonadism requiring testosterone replacement and he should not be obtaining this by any other means or self injecting himself. Again testosterone should be given at lowest possible dose after confirmed diagnosis of hypogonadism.   I will continue phlebotomy until hct <50 Q2 weeks. I will see him in 1 month for possible phlebotomy and I will check cbc and testosterone levels at that time. I am not in favor of continuing phlebotomy if he continues to get testosterone injections in an uncontrolled fashion   Visit Diagnosis 1. Polycythemia, secondary      Dr. Randa Evens, MD, MPH [place] [facility] Pager-  [phone] 03/23/2017 2:16 PM

## 2017-03-23 NOTE — Progress Notes (Signed)
Patient offers no complaints today. 

## 2017-04-06 ENCOUNTER — Inpatient Hospital Stay: Payer: BLUE CROSS/BLUE SHIELD

## 2017-04-24 ENCOUNTER — Inpatient Hospital Stay: Payer: BLUE CROSS/BLUE SHIELD

## 2017-04-24 ENCOUNTER — Inpatient Hospital Stay: Payer: BLUE CROSS/BLUE SHIELD | Admitting: Oncology

## 2017-09-20 ENCOUNTER — Other Ambulatory Visit: Payer: Self-pay | Admitting: *Deleted

## 2017-12-21 ENCOUNTER — Telehealth: Payer: Self-pay | Admitting: *Deleted

## 2017-12-21 NOTE — Telephone Encounter (Signed)
Pt called and said that he spoke with his boss and he can come at the end of the week next week. He can't get off on Monday to come. He wants 4/5. I explained to him that I will put him on when he says but his hct is high as of 3/1 and he has increase risk for stroke or heart attack with hct high. He says he know that and it has been higher in the past but he needs to keep his job.  I made him hte appt that he wants and said if he has bad HA, vision changes, numbness and tingling, speech problems and chest pain he should go to ER. He agrees with plan.  The appt is 4/5 and 2:30 for labs and then see md and phlebotomy.

## 2017-12-24 ENCOUNTER — Other Ambulatory Visit: Payer: Self-pay | Admitting: *Deleted

## 2017-12-24 DIAGNOSIS — D751 Secondary polycythemia: Secondary | ICD-10-CM

## 2017-12-28 ENCOUNTER — Inpatient Hospital Stay (HOSPITAL_BASED_OUTPATIENT_CLINIC_OR_DEPARTMENT_OTHER): Payer: BLUE CROSS/BLUE SHIELD | Admitting: Oncology

## 2017-12-28 ENCOUNTER — Encounter: Payer: Self-pay | Admitting: Oncology

## 2017-12-28 ENCOUNTER — Telehealth: Payer: Self-pay | Admitting: *Deleted

## 2017-12-28 ENCOUNTER — Inpatient Hospital Stay: Payer: BLUE CROSS/BLUE SHIELD

## 2017-12-28 ENCOUNTER — Inpatient Hospital Stay: Payer: BLUE CROSS/BLUE SHIELD | Attending: Oncology

## 2017-12-28 ENCOUNTER — Encounter: Payer: Self-pay | Admitting: *Deleted

## 2017-12-28 VITALS — BP 140/86 | HR 94 | Temp 98.0°F | Resp 18 | Ht 69.5 in | Wt 209.2 lb

## 2017-12-28 DIAGNOSIS — Z79899 Other long term (current) drug therapy: Secondary | ICD-10-CM | POA: Diagnosis not present

## 2017-12-28 DIAGNOSIS — G4730 Sleep apnea, unspecified: Secondary | ICD-10-CM | POA: Diagnosis not present

## 2017-12-28 DIAGNOSIS — D751 Secondary polycythemia: Secondary | ICD-10-CM | POA: Diagnosis not present

## 2017-12-28 DIAGNOSIS — R5383 Other fatigue: Secondary | ICD-10-CM

## 2017-12-28 DIAGNOSIS — T387X5D Adverse effect of androgens and anabolic congeners, subsequent encounter: Secondary | ICD-10-CM

## 2017-12-28 DIAGNOSIS — Z87891 Personal history of nicotine dependence: Secondary | ICD-10-CM | POA: Diagnosis not present

## 2017-12-28 LAB — CBC WITH DIFFERENTIAL/PLATELET
BASOS ABS: 0.1 10*3/uL (ref 0–0.1)
Basophils Relative: 1 %
EOS PCT: 1 %
Eosinophils Absolute: 0.1 10*3/uL (ref 0–0.7)
HEMATOCRIT: 60.6 % — AB (ref 40.0–52.0)
Hemoglobin: 20.9 g/dL — ABNORMAL HIGH (ref 13.0–18.0)
LYMPHS ABS: 1.9 10*3/uL (ref 1.0–3.6)
Lymphocytes Relative: 17 %
MCH: 31.5 pg (ref 26.0–34.0)
MCHC: 34.5 g/dL (ref 32.0–36.0)
MCV: 91.3 fL (ref 80.0–100.0)
MONO ABS: 0.8 10*3/uL (ref 0.2–1.0)
Monocytes Relative: 7 %
NEUTROS ABS: 8.2 10*3/uL — AB (ref 1.4–6.5)
Neutrophils Relative %: 74 %
PLATELETS: 218 10*3/uL (ref 150–440)
RBC: 6.64 MIL/uL — ABNORMAL HIGH (ref 4.40–5.90)
RDW: 14.4 % (ref 11.5–14.5)
WBC: 11 10*3/uL — ABNORMAL HIGH (ref 3.8–10.6)

## 2017-12-28 NOTE — Telephone Encounter (Signed)
Pt in the office to start back getting phlebotomies due to polycythemia sec. To testosterone use. He will need to come every week until his hct is less than 50.  We have made 8 appt for pt to come in and get phlebotomies.  We have made appts for late in the day as possible and I wrote a letter to send to his employer so he may leave 5-10 min. Early one day a week to get to the office and get treatment.

## 2017-12-28 NOTE — Progress Notes (Signed)
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 No re establishing care to restart phlebotomy  Heme/Onc history: patient is a 57 year old male was then seen by Dr. Erlene Quan back in 2008 for polycythemia. I do not have those records at this time. He has been referred to [place] for polycythemia. CBC from 12/14/2016 showed white count of 10.9, H&H of 19.8/excision on with a platelet count of 207. CMP was significant for a blood sugar of 188. TSH was within normal limits. Total testosterone was significantly elevated at 1176. Patient noted to have a hemoglobin of 15-16 between 2014 in 2015 and was found to have a hemoglobin of 19.9 on 02/15/2017  Patient states that he has possible hypogonadism (although not formally diagnosed)and report significant fatigue when he is not on testosterone. He has never seen a urologist for the same. he has been injecting himself testosterone shots mainly for his fatigue and building muscle. Denies any headaches or lightheadedness chest pain or strokelike symptoms. He has had coronary disease in the past and has had stent placements. He has smoked occasional cigars in the past. No known chronic lung disease  Further workup on 02/15/2017 revealed no evidence of jak 2 and exon 12 mutation. Urinalysis is negative for hematuria. Chest x-ray showed coarse interstitial markings. No acute disease  Patient went through phlebotomy in July 2019. He did not follow up after that    Interval history- Patient states that he felt well after phlebotomy after last year and did not feel the need to follow up since. He  continues to take testosterone but will not tell me from where he gets it. Says that many people who come to his gym also are abusing testosterone. He states that he was receiving testosterone from his pcp in the past but none recently. He feels fatigued if he does not take testosterone and feels that he can lift heavy weights and has overall sense of wellbeing when he takes testosterone  ECOG PS- 0 Pain scale- 0   Review of systems- Review of Systems  Constitutional: Negative for chills, fever, malaise/fatigue and weight loss.  HENT: Negative for congestion, ear discharge and nosebleeds.   Eyes: Negative for blurred vision.  Respiratory: Negative for cough, hemoptysis, sputum production, shortness of breath and wheezing.   Cardiovascular: Negative for chest pain, palpitations, orthopnea and claudication.  Gastrointestinal: Negative for abdominal pain, blood in stool, constipation, diarrhea, heartburn, melena, nausea and vomiting.  Genitourinary: Negative for dysuria, flank pain, frequency, hematuria and urgency.  Musculoskeletal: Negative for back pain, joint pain and myalgias.  Skin: Negative for rash.  Neurological: Negative for dizziness, tingling, focal weakness, seizures, weakness and headaches.  Endo/Heme/Allergies: Does not bruise/bleed easily.  Psychiatric/Behavioral: Negative for depression and suicidal ideas. The patient does not have insomnia.       Not on File   Past Medical History:  Diagnosis Date  . Diabetes mellitus without complication ([place])   . Heart disease   . Sleep apnea in adult      Past Surgical History:  Procedure Laterality Date  . STENT PLACEMENT VASCULAR ([place] [facility])      Social History   Socioeconomic History  .  Marital status: Married    Spouse name: Not on file  . Number of children: Not on file  . Years of education: Not on file  . Highest education level: Not on file  Occupational History  . Not on file  Social Needs  . Financial  resource strain: Not on file  . Food insecurity:    Worry: Not on file    Inability: Not on file  . Transportation needs:    Medical: Not on file    Non-medical: Not on file  Tobacco Use  . Smoking status: Former Smoker    Types: Cigars  . Smokeless tobacco: Never Used  . Tobacco comment: occasional cigars  Substance and Sexual Activity  . Alcohol use: Not on file  . Drug use: Not on file  . Sexual activity: Not on file  Lifestyle  . Physical activity:    Days per week: Not on file    Minutes per session: Not on file  . Stress: Not on file  Relationships  . Social connections:    Talks on phone: Not on file    Gets together: Not on file    Attends religious service: Not on file    Active member of club or organization: Not on file    Attends meetings of clubs or organizations: Not on file    Relationship status: Not on file  . Intimate partner violence:    Fear of current or ex partner: Not on file    Emotionally abused: Not on file    Physically abused: Not on file    Forced sexual activity: Not on file  Other Topics Concern  . Not on file  Social History Narrative  . Not on file    No family history on file.   Current Outpatient Medications:  .  REPATHA SURECLICK 166 MG/ML SOAJ, 140 mg., Disp: , Rfl:   Physical exam: There were no vitals filed for this visit. Physical Exam  Constitutional: He is oriented to person, place, and time.  Well built, muscular. Also has ruddy, plethoric look  HENT:  Head: Normocephalic and atraumatic.  Eyes: Pupils are equal, round, and reactive to light. EOM are normal.  Neck: Normal range of motion.  Cardiovascular: Normal rate, regular rhythm and normal heart sounds.  Pulmonary/Chest: Effort normal and breath sounds normal.  Abdominal: Soft. Bowel sounds are normal.  Neurological: He is alert and oriented to person, place, and time.  Skin: Skin is warm and dry.     CMP Latest Ref Rng & Units 08/12/2014  Glucose 65 - 99  mg/dL 181(H)  BUN 7 - 18 mg/dL 21(H)  Creatinine 0.60 - 1.30 mg/dL 0.69  Sodium 136 - 145 mmol/L 139  Potassium 3.5 - 5.1 mmol/L 4.2  Chloride 98 - 107 mmol/L 110(H)  CO2 21 - 32 mmol/L 24  Calcium 8.5 - 10.1 mg/dL 8.3(L)   CBC Latest Ref Rng & Units 12/28/2017  WBC 3.8 - 10.6 K/uL 11.0(H)  Hemoglobin 13.0 - 18.0 g/dL 20.9(H)  Hematocrit 40.0 - 52.0 % 60.6(H)  Platelets 150 - 440 K/uL 218      Assessment and plan- Patient is a 57 y.o. 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 My nurse Novella Olive has spoken with [facility] and they have confirmed that they have not been giving him any testosterone. I do not see any prior formal diagnosis of hypogonadism.  His hematocrit today is 60.6. Testosterone levels are pending. I will therefore proceed with weekly phlebotomy until hct <50. I will see him back in 2 months with cbc and testosterone levels  I have again strongly recommended that he should stop using testosterone from outside unverified sources. Testosterone replacement therapy should be prescribed by physician alone after hypogonadism diagnosis is made.  Testosterone use is associated with life threatening complications including all but not limited to risk of prostate cancer, BPH, possible cardiovascular risk such as heart attacks, risk of venous thromboembolism including DVT and PE and elevated hemoglobin. Patients >50 on testosterone replacement therapy needs PSA checked and urology f/u  Patient understands the risks associated with uncontrolled use of testosterone he has had stent placement in the past as well. He assures me today that he will no longer take testosterone. I have again offered referral to urology for further diagnosis of hypogonadism and testosterone replacement therapy if need be if he were to be diagnosed with hypogonadism. Patient would like to wait on urology  referral for about 2 months until his testosterone levels start trending down. He understands that if he were to have the urge to use testosterone in the interim, he will call us and we will refer him to urology sooner  This entire conversation was witnessed by by nurse RN Novella Olive.   Visit Diagnosis 1. Secondary polycythemia   2. Adverse effect of testosterone, subsequent encounter      Dr. Randa Evens, MD, MPH [facility] at [facility] [phone] 12/31/2017 9:35 AM

## 2017-12-28 NOTE — Patient Instructions (Addendum)
Hematocrit is 60 today. I therefore recommend weekly phlebotomy to bring hematocrit <50. I will see him back in 2 months with cbc and testosterone levels   Testosterone replacement therapy should be prescribed by physician alone after hypogonadism diagnosis is made.  Testosterone use is associated with life threatening complications including all but not limited to risk of prostate cancer, BPH, possible cardiovascular risk such as heart attacks, risk of venous thromboembolism including DVT and PE and elevated hemoglobin  I have again offered referral to urology for further diagnosis of hypogonadism and testosterone replacement therapy if need be.   You have not used testosterone in 2 weeks and you have agreed to abstain from it and you will consider urology referral in 2 months  Dr. Randa Evens, MD, MPH [facility] at [facility] 12/28/2017 3:09 PM

## 2017-12-29 LAB — TESTOSTERONE: TESTOSTERONE: 1093 ng/dL — AB (ref 264–916)

## 2017-12-30 ENCOUNTER — Other Ambulatory Visit: Payer: Self-pay | Admitting: *Deleted

## 2017-12-30 DIAGNOSIS — T387X5D Adverse effect of androgens and anabolic congeners, subsequent encounter: Secondary | ICD-10-CM

## 2017-12-30 DIAGNOSIS — D751 Secondary polycythemia: Secondary | ICD-10-CM

## 2018-01-03 ENCOUNTER — Inpatient Hospital Stay: Payer: BLUE CROSS/BLUE SHIELD

## 2018-01-03 ENCOUNTER — Other Ambulatory Visit: Payer: Self-pay

## 2018-01-03 DIAGNOSIS — T387X5D Adverse effect of androgens and anabolic congeners, subsequent encounter: Secondary | ICD-10-CM

## 2018-01-03 DIAGNOSIS — D751 Secondary polycythemia: Secondary | ICD-10-CM | POA: Diagnosis not present

## 2018-01-03 LAB — COMPREHENSIVE METABOLIC PANEL
ALBUMIN: 4.2 g/dL (ref 3.5–5.0)
ALT: 37 U/L (ref 17–63)
AST: 29 U/L (ref 15–41)
Alkaline Phosphatase: 68 U/L (ref 38–126)
Anion gap: 9 (ref 5–15)
BUN: 31 mg/dL — AB (ref 6–20)
CHLORIDE: 108 mmol/L (ref 101–111)
CO2: 20 mmol/L — ABNORMAL LOW (ref 22–32)
Calcium: 9 mg/dL (ref 8.9–10.3)
Creatinine, Ser: 0.96 mg/dL (ref 0.61–1.24)
GFR calc Af Amer: >60 mL/min (ref >60)
GFR calc non Af Amer: >60 mL/min (ref >60)
Glucose, Bld: 145 mg/dL — ABNORMAL HIGH (ref 65–99)
POTASSIUM: 4 mmol/L (ref 3.5–5.1)
Sodium: 137 mmol/L (ref 135–145)
Total Bilirubin: 1.2 mg/dL (ref 0.3–1.2)
Total Protein: 7.3 g/dL (ref 6.5–8.1)

## 2018-01-03 LAB — CBC WITH DIFFERENTIAL/PLATELET
Basophils Absolute: 0.1 10*3/uL (ref 0–0.1)
Basophils Relative: 1 %
EOS PCT: 3 %
Eosinophils Absolute: 0.3 10*3/uL (ref 0–0.7)
HCT: 58.6 % — ABNORMAL HIGH (ref 40.0–52.0)
Hemoglobin: 20.3 g/dL — ABNORMAL HIGH (ref 13.0–18.0)
LYMPHS PCT: 23 %
Lymphs Abs: 2 10*3/uL (ref 1.0–3.6)
MCH: 31.3 pg (ref 26.0–34.0)
MCHC: 34.6 g/dL (ref 32.0–36.0)
MCV: 90.4 fL (ref 80.0–100.0)
MONO ABS: 0.7 10*3/uL (ref 0.2–1.0)
Monocytes Relative: 8 %
Neutro Abs: 5.7 10*3/uL (ref 1.4–6.5)
Neutrophils Relative %: 65 %
PLATELETS: 223 10*3/uL (ref 150–440)
RBC: 6.48 MIL/uL — ABNORMAL HIGH (ref 4.40–5.90)
RDW: 14.5 % (ref 11.5–14.5)
WBC: 8.9 10*3/uL (ref 3.8–10.6)

## 2018-01-04 LAB — TESTOSTERONE: TESTOSTERONE: 623 ng/dL (ref 264–916)

## 2018-01-11 ENCOUNTER — Inpatient Hospital Stay: Payer: BLUE CROSS/BLUE SHIELD

## 2018-01-11 ENCOUNTER — Ambulatory Visit: Payer: BLUE CROSS/BLUE SHIELD

## 2018-01-11 DIAGNOSIS — D751 Secondary polycythemia: Secondary | ICD-10-CM | POA: Diagnosis not present

## 2018-01-11 DIAGNOSIS — T387X5D Adverse effect of androgens and anabolic congeners, subsequent encounter: Secondary | ICD-10-CM

## 2018-01-11 LAB — HEMATOCRIT: HEMATOCRIT: 56.9 % — AB (ref 40.0–52.0)

## 2018-01-18 ENCOUNTER — Inpatient Hospital Stay: Payer: BLUE CROSS/BLUE SHIELD

## 2018-01-18 VITALS — BP 125/77 | HR 79 | Temp 98.0°F | Resp 20

## 2018-01-18 DIAGNOSIS — T387X5D Adverse effect of androgens and anabolic congeners, subsequent encounter: Secondary | ICD-10-CM

## 2018-01-18 DIAGNOSIS — D751 Secondary polycythemia: Secondary | ICD-10-CM

## 2018-01-18 LAB — HEMATOCRIT: HEMATOCRIT: 51.6 % (ref 40.0–52.0)

## 2018-01-25 ENCOUNTER — Inpatient Hospital Stay: Payer: BLUE CROSS/BLUE SHIELD

## 2018-01-25 ENCOUNTER — Inpatient Hospital Stay: Payer: BLUE CROSS/BLUE SHIELD | Attending: Oncology

## 2018-01-25 ENCOUNTER — Other Ambulatory Visit: Payer: Self-pay

## 2018-01-25 DIAGNOSIS — T387X5D Adverse effect of androgens and anabolic congeners, subsequent encounter: Secondary | ICD-10-CM

## 2018-01-25 DIAGNOSIS — D751 Secondary polycythemia: Secondary | ICD-10-CM | POA: Diagnosis present

## 2018-01-25 DIAGNOSIS — Z87891 Personal history of nicotine dependence: Secondary | ICD-10-CM | POA: Insufficient documentation

## 2018-01-25 LAB — HEMATOCRIT: HCT: 48.5 % (ref 40.0–52.0)

## 2018-01-25 NOTE — Progress Notes (Signed)
No tx needed today, Hct WNL per orders, pt informed.

## 2018-02-01 ENCOUNTER — Inpatient Hospital Stay: Payer: BLUE CROSS/BLUE SHIELD

## 2018-02-01 DIAGNOSIS — D751 Secondary polycythemia: Secondary | ICD-10-CM

## 2018-02-01 DIAGNOSIS — T387X5D Adverse effect of androgens and anabolic congeners, subsequent encounter: Secondary | ICD-10-CM

## 2018-02-01 LAB — HEMATOCRIT: HEMATOCRIT: 47.7 % (ref 40.0–52.0)

## 2018-02-01 NOTE — Progress Notes (Signed)
Pt informed that he does not need phlebotomy today, labs are WDL, pt verbalizes understanding.

## 2018-02-08 ENCOUNTER — Other Ambulatory Visit: Payer: BLUE CROSS/BLUE SHIELD

## 2018-02-15 ENCOUNTER — Inpatient Hospital Stay: Payer: BLUE CROSS/BLUE SHIELD

## 2018-02-15 DIAGNOSIS — D751 Secondary polycythemia: Secondary | ICD-10-CM | POA: Diagnosis not present

## 2018-02-15 DIAGNOSIS — T387X5D Adverse effect of androgens and anabolic congeners, subsequent encounter: Secondary | ICD-10-CM

## 2018-02-15 LAB — HEMATOCRIT: HCT: 44.3 % (ref 40.0–52.0)

## 2018-03-05 ENCOUNTER — Encounter: Payer: Self-pay | Admitting: Oncology

## 2018-03-05 ENCOUNTER — Other Ambulatory Visit: Payer: Self-pay

## 2018-03-05 ENCOUNTER — Inpatient Hospital Stay: Payer: BLUE CROSS/BLUE SHIELD

## 2018-03-05 ENCOUNTER — Inpatient Hospital Stay: Payer: BLUE CROSS/BLUE SHIELD | Attending: Oncology | Admitting: Oncology

## 2018-03-05 VITALS — BP 157/77 | HR 85 | Temp 98.0°F | Resp 18 | Ht 69.5 in | Wt 201.0 lb

## 2018-03-05 DIAGNOSIS — D751 Secondary polycythemia: Secondary | ICD-10-CM | POA: Diagnosis present

## 2018-03-05 DIAGNOSIS — E119 Type 2 diabetes mellitus without complications: Secondary | ICD-10-CM | POA: Insufficient documentation

## 2018-03-05 DIAGNOSIS — Z79899 Other long term (current) drug therapy: Secondary | ICD-10-CM | POA: Diagnosis not present

## 2018-03-05 DIAGNOSIS — Z794 Long term (current) use of insulin: Secondary | ICD-10-CM

## 2018-03-05 DIAGNOSIS — R5383 Other fatigue: Secondary | ICD-10-CM | POA: Diagnosis not present

## 2018-03-05 DIAGNOSIS — I2510 Atherosclerotic heart disease of native coronary artery without angina pectoris: Secondary | ICD-10-CM | POA: Diagnosis not present

## 2018-03-05 DIAGNOSIS — G4730 Sleep apnea, unspecified: Secondary | ICD-10-CM | POA: Diagnosis not present

## 2018-03-05 DIAGNOSIS — R5381 Other malaise: Secondary | ICD-10-CM | POA: Insufficient documentation

## 2018-03-05 LAB — CBC WITH DIFFERENTIAL/PLATELET
BASOS ABS: 0.1 10*3/uL (ref 0–0.1)
BASOS PCT: 1 %
EOS ABS: 0.2 10*3/uL (ref 0–0.7)
Eosinophils Relative: 2 %
HCT: 45.9 % (ref 40.0–52.0)
HEMOGLOBIN: 16.3 g/dL (ref 13.0–18.0)
Lymphocytes Relative: 28 %
Lymphs Abs: 2.7 10*3/uL (ref 1.0–3.6)
MCH: 31.6 pg (ref 26.0–34.0)
MCHC: 35.4 g/dL (ref 32.0–36.0)
MCV: 89.2 fL (ref 80.0–100.0)
Monocytes Absolute: 0.6 10*3/uL (ref 0.2–1.0)
Monocytes Relative: 7 %
NEUTROS PCT: 62 %
Neutro Abs: 5.9 10*3/uL (ref 1.4–6.5)
Platelets: 251 10*3/uL (ref 150–440)
RBC: 5.15 MIL/uL (ref 4.40–5.90)
RDW: 14 % (ref 11.5–14.5)
WBC: 9.5 10*3/uL (ref 3.8–10.6)

## 2018-03-05 NOTE — Progress Notes (Signed)
No phlebotomy today per MD. ?

## 2018-03-05 NOTE — Progress Notes (Signed)
Fatigue because of the no testosterone.

## 2018-03-06 LAB — TESTOSTERONE: Testosterone: 276 ng/dL (ref 264–916)

## 2018-03-07 NOTE — Progress Notes (Signed)
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 Erlene Quan back in 2008 for polycythemia. I do not have those records at this time. He has been referred to [place] for polycythemia. CBC from 12/14/2016 showed white count of 10.9, H&H of 19.8/excision on with a platelet count of 207. CMP was significant for a blood sugar of 188. TSH was within normal limits. Total testosterone was significantly elevated at 1176.Patient noted to have a hemoglobin of 15-16 between 2014 in 2015 and was found to have a hemoglobin of 19.9 on 02/15/2017  Patient states that he has possible hypogonadism (although not formally diagnosed)and report significant fatigue when he is not on testosterone. He has never seen a urologist for the same. he has been injecting himself testosterone shots mainly for his fatigue and building muscle. Denies any headaches or lightheadedness chest pain or strokelike symptoms. He has had coronary disease in the past and has had stent placements. He has smoked occasional cigars in the past. No known chronic lung disease  Further workup on 02/15/2017 revealed no evidence of jak 2 and exon 12 mutation. Urinalysis is negative for hematuria. Chest x-ray showed coarse interstitial markings. No acute disease  Patient went through phlebotomy in July 2019. He did not follow up after that  Interval history- Patient reports he is now off completely off testosterone for last 2 months. He reports feeling fatigued and not himself since he stopped it. He continues to work out and has  been taking protein supplements for energy  ECOG PS- 0 Pain scale- 0  Review of systems- Review of Systems  Constitutional: Positive for malaise/fatigue. Negative for chills, fever and weight loss.  HENT: Negative for congestion, ear discharge and nosebleeds.   Eyes: Negative for blurred vision.  Respiratory: Negative for cough, hemoptysis, sputum production, shortness of breath and wheezing.   Cardiovascular: Negative for chest pain, palpitations, orthopnea and claudication.  Gastrointestinal: Negative for abdominal pain, blood in stool, constipation, diarrhea, heartburn, melena, nausea and vomiting.  Genitourinary: Negative for dysuria, flank pain, frequency, hematuria and urgency.  Musculoskeletal: Negative for back pain, joint pain and myalgias.  Skin: Negative for rash.  Neurological: Negative for dizziness, tingling, focal weakness, seizures, weakness and headaches.  Endo/Heme/Allergies: Does not bruise/bleed easily.  Psychiatric/Behavioral: Negative for depression and suicidal ideas. The patient does not have insomnia.       No Known Allergies   Past Medical History:  Diagnosis Date  . Diabetes mellitus without complication ([place])   . Heart disease   . Sleep apnea in adult      Past Surgical History:  Procedure Laterality Date  . STENT PLACEMENT VASCULAR ([place] [facility])      Social History   Socioeconomic History  . Marital status: Married    Spouse name: Not on file  . Number of children: Not on file  . Years of education: Not on file  . Highest education level: Not on file  Occupational History  . Not on file  Social Needs  . Financial resource strain: Not on file  . Food insecurity:    Worry: Not on  file    Inability: Not on file  . Transportation needs:    Medical: Not on file    Non-medical: Not on file  Tobacco Use  . Smoking status: Former Smoker    Types: Cigars  . Smokeless tobacco: Never Used  . Tobacco comment: occasional cigars  Substance and  Sexual Activity  . Alcohol use: Not on file  . Drug use: Not on file  . Sexual activity: Not on file  Lifestyle  . Physical activity:    Days per week: Not on file    Minutes per session: Not on file  . Stress: Not on file  Relationships  . Social connections:    Talks on phone: Not on file    Gets together: Not on file    Attends religious service: Not on file    Active member of club or organization: Not on file    Attends meetings of clubs or organizations: Not on file    Relationship status: Not on file  . Intimate partner violence:    Fear of current or ex partner: Not on file    Emotionally abused: Not on file    Physically abused: Not on file    Forced sexual activity: Not on file  Other Topics Concern  . Not on file  Social History Narrative  . Not on file    History reviewed. No pertinent family history.   Current Outpatient Medications:  .  Insulin Glargine (TOUJEO MAX SOLOSTAR) 300 UNIT/ML SOPN, Inject 10 Units into the skin 2 (two) times daily., Disp: , Rfl:  .  NON FORMULARY, Take 2 capsules by mouth daily at 2 am. varceva- fish oil, Disp: , Rfl:  .  REPATHA SURECLICK 350 MG/ML SOAJ, 140 mg. 2 times a month, Disp: , Rfl:   Physical exam:  Vitals:   03/05/18 1518  BP: (!) 157/77  Pulse: 85  Resp: 18  Temp: 98 F (36.7 C)  TempSrc: Tympanic  Weight: 201 lb (91.2 kg)  Height: 5' 9.5" (1.765 m)   Physical Exam  Constitutional: He is oriented to person, place, and time. He appears well-developed and well-nourished.  Face and body is not as plethoric anymore  HENT:  Head: Normocephalic and atraumatic.  Eyes: Pupils are equal, round, and reactive to light. EOM are normal.  Neck: Normal range of motion.  Cardiovascular: Normal rate, regular rhythm and normal heart sounds.  Pulmonary/Chest: Effort normal and breath sounds normal.  Abdominal: Soft. Bowel sounds are normal.  Neurological: He is alert and oriented to person, place, and time.  Skin: Skin is  warm and dry.     CMP Latest Ref Rng & Units 01/03/2018  Glucose 65 - 99 mg/dL 145(H)  BUN 6 - 20 mg/dL 31(H)  Creatinine 0.61 - 1.24 mg/dL 0.96  Sodium 135 - 145 mmol/L 137  Potassium 3.5 - 5.1 mmol/L 4.0  Chloride 101 - 111 mmol/L 108  CO2 22 - 32 mmol/L 20(L)  Calcium 8.9 - 10.3 mg/dL 9.0  Total Protein 6.5 - 8.1 g/dL 7.3  Total Bilirubin 0.3 - 1.2 mg/dL 1.2  Alkaline Phos 38 - 126 U/L 68  AST 15 - 41 U/L 29  ALT 17 - 63 U/L 37   CBC Latest Ref Rng & Units 03/05/2018  WBC 3.8 - 10.6 K/uL 9.5  Hemoglobin 13.0 - 18.0 g/dL 16.3  Hematocrit 40.0 - 52.0 % 45.9  Platelets 150 - 440 K/uL 251      Assessment and plan- Patient is  a 57 y.o. male with h/o testosterone induced secondary polycythemia  Patient is completely off testosterone for 2 months now. His testosterone levels in office today are 276. Reports that levels done in his PCP's office were 96.   His hematocrit is down to 45 today and he has only required 2-3 phlebotomy sessions so far. No need for phlebotomy today. I will see him back in 3 months with cbc and testosterone levels for possible phlebotomy. As long as he remains off testosterone he will not require phlebotomy  He will see urology in 1-2 weeks to discuss need for testosterone replacement therapy. Should a diagnosis of hypogonadism be made and if he required testosterone replacement therapy- it should be done through urology with regular monitoring given his h/o polycythemia and CAD. He should be started on lowest possible doses that would alleviate his symptoms  He will call us if he goes back on testosterone and we will see him sooner. Again I am not in favor of continuing phlebotomy if he restarts using his own testosterone supplements in an uncontrolled fashion given the health risks associated with it   Visit Diagnosis 1. Polycythemia, secondary      Dr. Randa Evens, MD, MPH [facility] at [facility] [phone] 03/07/2018 8:48  AM

## 2018-03-12 ENCOUNTER — Encounter: Payer: Self-pay | Admitting: Urology

## 2018-03-12 ENCOUNTER — Ambulatory Visit (INDEPENDENT_AMBULATORY_CARE_PROVIDER_SITE_OTHER): Payer: BLUE CROSS/BLUE SHIELD | Admitting: Urology

## 2018-03-12 VITALS — BP 134/78 | HR 84 | Ht 69.0 in | Wt 200.8 lb

## 2018-03-12 DIAGNOSIS — N5203 Combined arterial insufficiency and corporo-venous occlusive erectile dysfunction: Secondary | ICD-10-CM | POA: Diagnosis not present

## 2018-03-12 DIAGNOSIS — E291 Testicular hypofunction: Secondary | ICD-10-CM | POA: Diagnosis not present

## 2018-03-12 NOTE — Progress Notes (Signed)
03/12/2018 4:00 PM   HENRIQUE PAREKH [birth year] [id number]  Referring provider: Remi Haggard, [place], [place] [address]  Chief Complaint  Patient presents with  . Polycythemia    HPI: 57 yo M who presents today to discuss his presumed diagnosis of hypogonadism.  He was recently seen and evaluated at the cancer center by Dr. Janese Banks for polycythemia.  He first presented to the cancer center in 01/2017 with a hemoglobin of 19.9/hematocrit 56.9.  This is felt to be related to exogenous testosterone use.  He has had therapeutic phlebotomy for this in the past.    He notes that he started exogenous testosterone use approximately 30 years ago for presumed diagnosis of hypogonadism.  His symptoms the time included fatigue.  He was never actually tested for hypogonadism and thus does not have a formal diagnosis.  He obtained exogenous testosterone on his own through his gym and has been administering this in a cyclic fashion for all of these years.  This last cycle, he was using testosterone ciponate 1 cc weekly until the end of March.    Since coming off testosterone, he is felt horrible.  He has noticed a decrease in the strength, altered mood, and severe fatigue.  He is also been struggling with lack of libido and erectile dysfunction which he also had somewhat while on testosterone.  When he was seen in early April, his testosterone level was at 1093.  This declined to 623 on 01/03/2018 and further to 273 on 03/05/2018.  He reports that he was seen once at least 5+ years ago by the urology group and was treated for a short time with injectable of a provider testosterone under the supervision but has not been seen in many years.  His records are unable to be located today.  He also believes that he has had a low testosterone at his sister's office, [employer].  He also has baseline erectile dysfunction with difficulty achieving and maintaining erections.  Since his  testosterone levels have been falling.  He does have multiple comorbidities/risk factors including diabetes and coronary artery disease status post multiple cardiac stents in the past.   PMH: Past Medical History:  Diagnosis Date  . Diabetes mellitus without complication ([facility])   . Heart disease   . Sleep apnea in adult     Surgical History: Past Surgical History:  Procedure Laterality Date  . STENT PLACEMENT VASCULAR ([facility] HX)      Home Medications:  Allergies as of 03/12/2018   No Known Allergies     Medication List        Accurate as of 03/12/18  4:00 PM. Always use your most recent med list.          TOUJEO MAX SOLOSTAR 300 UNIT/ML Sopn Generic drug:  Insulin Glargine Inject 10 Units into the skin 2 (two) times daily.       Allergies: No Known Allergies  Family History: History reviewed. No pertinent family history.  Social History:  reports that he has quit smoking. His smoking use included cigars. He has never used smokeless tobacco. He reports that he drank alcohol. He reports that he does not use drugs.  ROS: UROLOGY Frequent Urination?: No Hard to postpone urination?: No Burning/pain with urination?: No Get up at night to urinate?: No Leakage of urine?: No Urine stream starts and stops?: No Trouble starting stream?: No Do you have to strain to urinate?: No Blood in urine?: No Urinary tract infection?:  No Sexually transmitted disease?: No Injury to kidneys or bladder?: No Painful intercourse?: No Weak stream?: No Erection problems?: Yes Penile pain?: No  Gastrointestinal Nausea?: No Vomiting?: No Indigestion/heartburn?: No Diarrhea?: No Constipation?: No  Constitutional Fever: No Night sweats?: No Weight loss?: No Fatigue?: No  Skin Skin rash/lesions?: No Itching?: No  Eyes Blurred vision?: No Double vision?: No  Ears/Nose/Throat Sore throat?: No Sinus problems?: No  Hematologic/Lymphatic Swollen glands?: No Easy  bruising?: No  Cardiovascular Leg swelling?: No Chest pain?: No  Respiratory Cough?: No Shortness of breath?: No  Endocrine Excessive thirst?: No  Musculoskeletal Back pain?: No Joint pain?: No  Neurological Headaches?: No Dizziness?: No  Psychologic Depression?: No Anxiety?: No  Physical Exam: BP 134/78 (BP Location: Left Arm, Patient Position: Sitting, Cuff Size: Large)   Pulse 84   Ht 5\' 9"  (1.753 m)   Wt 200 lb 12.8 oz (91.1 kg)   SpO2 99%   BMI 29.65 kg/m   Constitutional:  Alert and oriented, No acute distress.  Muscular build. HEENT: [place] AT, moist mucus membranes.  Trachea midline, no masses. Cardiovascular: No clubbing, cyanosis, or edema. GU: Normal phallus with orthotopic meatus, no discharge.  Bilateral descended testicles without significant atrophy, no masses, nontender. Respiratory: Normal respiratory effort, no increased work of breathing. Skin: No rashes, bruises or suspicious lesions. Neurologic: Grossly intact, no focal deficits, moving all 4 extremities. Psychiatric: Somewhat agitated.  Laboratory Data: Lab Results  Component Value Date   WBC 9.5 03/05/2018   HGB 16.3 03/05/2018   HCT 45.9 03/05/2018   MCV 89.2 03/05/2018   PLT 251 03/05/2018    Lab Results  Component Value Date   CREATININE 0.96 01/03/2018    Lab Results  Component Value Date   TESTOSTERONE 276 03/05/2018     Urinalysis    Component Value Date/Time   COLORURINE YELLOW (A) 02/15/2017 1544   APPEARANCEUR CLEAR (A) 02/15/2017 1544   LABSPEC 1.017 02/15/2017 1544   PHURINE 6.0 02/15/2017 1544   GLUCOSEU >=500 (A) 02/15/2017 1544   HGBUR NEGATIVE 02/15/2017 1544   BILIRUBINUR NEGATIVE 02/15/2017 1544   KETONESUR 5 (A) 02/15/2017 1544   PROTEINUR NEGATIVE 02/15/2017 1544   NITRITE NEGATIVE 02/15/2017 1544   LEUKOCYTESUR NEGATIVE 02/15/2017 1544    Lab Results  Component Value Date   BACTERIA NONE SEEN 02/15/2017    Pertinent Imaging: n/a  Assessment &  Plan:    1. Hypogonadism in male Extensive history of exogenous testosterone abuse not under the care of a provider He is seeking to establish care with a physician to administer testosterone in a safe regulated way He would be interested in resuming testosterone cypionate We discussed that at this point time, he does not meet the criteria for hypogonadism as this is never been formally diagnosed We also discussed the option of allowing his testicles to recover to produce their own testosterone rather than continue to use exogenous testosterone which could take quite some time, he is not interested in this option We will go ahead and obtain baseline labs later this week and consider testosterone supplementation as deemed appropriate based on this lab data He understands the risk of use of exogenous testosterone including possible cardiovascular risks although this is somewhat controversial amongst others.  All questions were answered today. - TSH; Future - FSH/LH; Future - PSA; Future - Prolactin; Future - Testosterone; Future  2. Combined arterial insufficiency and corporo-venous occlusive erectile dysfunction Likely multifactorial given multiple risk factors We will discuss this further in follow-up  visit   Return for labs only (T/ FSH/ LH/ TSH/ prolactin), .  Hollice Espy, MD  [facility] [address], [place], [place] [address] [phone]  I spent 45 min with this patient of which greater than 50% was spent in counseling and coordination of care with the patient.

## 2018-03-13 ENCOUNTER — Encounter: Payer: Self-pay | Admitting: Urology

## 2018-03-15 ENCOUNTER — Other Ambulatory Visit: Payer: BLUE CROSS/BLUE SHIELD

## 2018-03-15 DIAGNOSIS — E291 Testicular hypofunction: Secondary | ICD-10-CM

## 2018-03-16 LAB — FSH/LH
FSH: 4.3 m[IU]/mL (ref 1.5–12.4)
LH: 6.5 m[IU]/mL (ref 1.7–8.6)

## 2018-03-16 LAB — PSA: Prostate Specific Ag, Serum: 0.3 ng/mL (ref 0.0–4.0)

## 2018-03-16 LAB — TESTOSTERONE: Testosterone: 328 ng/dL (ref 264–916)

## 2018-03-16 LAB — TSH: TSH: 1.86 u[IU]/mL (ref 0.450–4.500)

## 2018-03-16 LAB — PROLACTIN: PROLACTIN: 8.2 ng/mL (ref 4.0–15.2)

## 2018-03-20 ENCOUNTER — Telehealth: Payer: Self-pay

## 2018-03-20 NOTE — Telephone Encounter (Signed)
Patient notified will call back to schedule follow up

## 2018-03-20 NOTE — Telephone Encounter (Signed)
-----   Message from Hollice Espy, MD sent at 03/19/2018  2:03 PM EDT ----- Please have this patient know that his testosterone is rising.  He is now 328 which is up significantly from 276 just 2 weeks before.    At this time,  he does not meet the criteria for hypogonadism.  We can recheck these labs again in about 2 months to see, but it looks like his own testicular production of testosterone is revving up on its own.  This is awesome news.   Hollice Espy, MD

## 2018-03-20 NOTE — Telephone Encounter (Signed)
Left pt mess to call 

## 2018-06-04 ENCOUNTER — Ambulatory Visit: Payer: BLUE CROSS/BLUE SHIELD | Admitting: Oncology

## 2018-06-04 ENCOUNTER — Other Ambulatory Visit: Payer: BLUE CROSS/BLUE SHIELD

## 2018-11-21 ENCOUNTER — Other Ambulatory Visit: Payer: Self-pay

## 2018-11-21 ENCOUNTER — Encounter
Admission: RE | Admit: 2018-11-21 | Discharge: 2018-11-21 | Disposition: A | Discharge status: Home / Self Care | Payer: No Typology Code available for payment source | Source: Ambulatory Visit | Attending: Surgery | Admitting: Surgery

## 2018-11-21 DIAGNOSIS — Z01812 Encounter for preprocedural laboratory examination: Secondary | ICD-10-CM | POA: Diagnosis not present

## 2018-11-21 HISTORY — DX: Acute myocardial infarction, unspecified: I21.9

## 2018-11-21 HISTORY — DX: Atherosclerotic heart disease of native coronary artery without angina pectoris: I25.10

## 2018-11-21 NOTE — Patient Instructions (Signed)
Your procedure is scheduled on: 11/28/18 Report to Day Surgery. [facility] SECOND FLOOR To find out your arrival time please call [phone] between 1PM - 3PM on 11/27/18.  Remember: Instructions that are not followed completely may result in serious medical risk,  up to and including death, or upon the discretion of your surgeon and anesthesiologist your  surgery may need to be rescheduled.     _X__ 1. Do not eat food after midnight the night before your procedure.                 No gum chewing or hard candies. You may drink clear liquids up to 2 hours                 before you are scheduled to arrive for your surgery- DO not drink clear                 liquids within 2 hours of the start of your surgery.                 Clear Liquids include:  water, apple juice without pulp, clear carbohydrate                 drink such as Clearfast of Gatorade, Black Coffee or Tea (Do not add                 anything to coffee or tea).  __X__2.  On the morning of surgery brush your teeth with toothpaste and water, you                may rinse your mouth with mouthwash if you wish.  Do not swallow any toothpaste of mouthwash.     _X__ 3.  No Alcohol for 24 hours before or after surgery.   _X__ 4.  Do Not Smoke or use e-cigarettes For 24 Hours Prior to Your Surgery.                 Do not use any chewable tobacco products for at least 6 hours prior to                 surgery.  ____  5.  Bring all medications with you on the day of surgery if instructed.   __X__  6.  Notify your doctor if there is any change in your medical condition      (cold, fever, infections).     Do not wear jewelry, make-up, hairpins, clips or nail polish. Do not wear lotions, powders, or perfumes. You may wear deodorant. Do not shave 48 hours prior to surgery. Men may shave face and neck. Do not bring valuables to the hospital.    [facility] is not responsible for any belongings or  valuables.  Contacts, dentures or bridgework may not be worn into surgery. Leave your suitcase in the car. After surgery it may be brought to your room. For patients admitted to the hospital, discharge time is determined by your treatment team.   Patients discharged the day of surgery will not be allowed to drive home.   Please read over the following fact sheets that you were given:   Surgical Site Infection Prevention          ____ Take these medicines the morning of surgery with A SIP OF WATER:    1. NONE  2.   3.   4.  5.  6.  ____ Fleet Enema (as directed)  _X___ Use CHG Soap as directed  ____ Use inhalers on the day of surgery  ____ Stop metformin 2 days prior to surgery    _X__ Take 1/2 of usual insulin dose the night before surgery. No insulin the morning          of surgery.   _X___ Stop Coumadin/Plavix/aspirin on   CAN CONTINUE TO TAKE ASPIRIN  ____ Stop Anti-inflammatories on    __X__ Stop supplements until after surgery.   STOP VASCEPA UNTIL AFTER SURGERY  ____ Bring C-Pap to the hospital.

## 2018-11-21 NOTE — Pre-Procedure Instructions (Signed)
Value Ref Range  Vent Rate (bpm) 81   PR Interval (msec) 142   QRS Interval (msec) 98   QT Interval (msec) 378   QTc (msec) 439   Other Result Information  This result has an attachment that is not available.  Result Narrative  Normal sinus rhythm Normal ECG When compared with ECG of 21-Sep-2016 14:05,  Nonspecific T wave abnormalities no longer evident in Lateral leads I reviewed and concur with this report. Electronically signed VP:LWUZ MD, KEN [phone]) on 05/16/2018 4:49:47 PM  Status Results Details

## 2018-11-27 MED ORDER — CLINDAMYCIN PHOSPHATE 900 MG/50ML IV SOLN
900.0000 mg | Freq: Once | INTRAVENOUS | Status: AC
  Administered 2018-11-28: 900 mg via INTRAVENOUS

## 2018-11-28 ENCOUNTER — Ambulatory Visit: Payer: No Typology Code available for payment source | Admitting: Anesthesiology

## 2018-11-28 ENCOUNTER — Other Ambulatory Visit: Payer: Self-pay

## 2018-11-28 ENCOUNTER — Encounter
Admission: RE | Disposition: A | Discharge status: Home / Self Care | Payer: Self-pay | Source: Home / Self Care | Attending: Surgery

## 2018-11-28 ENCOUNTER — Encounter: Payer: Self-pay | Admitting: *Deleted

## 2018-11-28 ENCOUNTER — Ambulatory Visit
Admission: RE | Admit: 2018-11-28 | Discharge: 2018-11-28 | Disposition: A | Discharge status: Home / Self Care | Payer: No Typology Code available for payment source | Attending: Surgery | Admitting: Surgery

## 2018-11-28 DIAGNOSIS — I2510 Atherosclerotic heart disease of native coronary artery without angina pectoris: Secondary | ICD-10-CM | POA: Insufficient documentation

## 2018-11-28 DIAGNOSIS — I252 Old myocardial infarction: Secondary | ICD-10-CM | POA: Diagnosis not present

## 2018-11-28 DIAGNOSIS — Y9389 Activity, other specified: Secondary | ICD-10-CM | POA: Insufficient documentation

## 2018-11-28 DIAGNOSIS — X500XXA Overexertion from strenuous movement or load, initial encounter: Secondary | ICD-10-CM | POA: Insufficient documentation

## 2018-11-28 DIAGNOSIS — E119 Type 2 diabetes mellitus without complications: Secondary | ICD-10-CM | POA: Insufficient documentation

## 2018-11-28 DIAGNOSIS — S46211A Strain of muscle, fascia and tendon of other parts of biceps, right arm, initial encounter: Secondary | ICD-10-CM | POA: Diagnosis not present

## 2018-11-28 DIAGNOSIS — Z7982 Long term (current) use of aspirin: Secondary | ICD-10-CM | POA: Diagnosis not present

## 2018-11-28 DIAGNOSIS — Y990 Civilian activity done for income or pay: Secondary | ICD-10-CM | POA: Diagnosis not present

## 2018-11-28 DIAGNOSIS — Z9861 Coronary angioplasty status: Secondary | ICD-10-CM | POA: Insufficient documentation

## 2018-11-28 DIAGNOSIS — Z79899 Other long term (current) drug therapy: Secondary | ICD-10-CM | POA: Insufficient documentation

## 2018-11-28 DIAGNOSIS — Z794 Long term (current) use of insulin: Secondary | ICD-10-CM | POA: Diagnosis not present

## 2018-11-28 DIAGNOSIS — Z87891 Personal history of nicotine dependence: Secondary | ICD-10-CM | POA: Diagnosis not present

## 2018-11-28 DIAGNOSIS — S46911A Strain of unspecified muscle, fascia and tendon at shoulder and upper arm level, right arm, initial encounter: Secondary | ICD-10-CM | POA: Diagnosis present

## 2018-11-28 DIAGNOSIS — G4730 Sleep apnea, unspecified: Secondary | ICD-10-CM | POA: Diagnosis not present

## 2018-11-28 HISTORY — PX: BICEPS TENDON REPAIR: SHX566

## 2018-11-28 LAB — GLUCOSE, CAPILLARY
GLUCOSE-CAPILLARY: 172 mg/dL — AB (ref 70–99)
Glucose-Capillary: 181 mg/dL — ABNORMAL HIGH (ref 70–99)

## 2018-11-28 SURGERY — REPAIR, TENDON, BICEPS, PROXIMAL
Anesthesia: General | Laterality: Right

## 2018-11-28 MED ORDER — FAMOTIDINE 20 MG PO TABS
ORAL_TABLET | ORAL | Status: AC
  Administered 2018-11-28: 20 mg via ORAL
  Filled 2018-11-28: qty 1

## 2018-11-28 MED ORDER — CLINDAMYCIN PHOSPHATE 900 MG/50ML IV SOLN
INTRAVENOUS | Status: AC
  Filled 2018-11-28: qty 50

## 2018-11-28 MED ORDER — SODIUM CHLORIDE 0.9 % IV SOLN
INTRAVENOUS | Status: DC | PRN
  Administered 2018-11-28: 15 ug/min via INTRAVENOUS

## 2018-11-28 MED ORDER — POTASSIUM CHLORIDE IN NACL 20-0.9 MEQ/L-% IV SOLN: INTRAVENOUS | Status: DC

## 2018-11-28 MED ORDER — OXYCODONE HCL 5 MG PO TABS: 5.0000 mg | ORAL_TABLET | ORAL | Status: DC | PRN

## 2018-11-28 MED ORDER — DEXAMETHASONE SODIUM PHOSPHATE 10 MG/ML IJ SOLN
INTRAMUSCULAR | Status: DC | PRN
  Administered 2018-11-28: 10 mg via INTRAVENOUS

## 2018-11-28 MED ORDER — LIDOCAINE HCL (PF) 1 % IJ SOLN
INTRAMUSCULAR | Status: AC
  Filled 2018-11-28: qty 5

## 2018-11-28 MED ORDER — FENTANYL CITRATE (PF) 100 MCG/2ML IJ SOLN
INTRAMUSCULAR | Status: AC
  Administered 2018-11-28: 50 ug via INTRAVENOUS
  Filled 2018-11-28: qty 2

## 2018-11-28 MED ORDER — METOCLOPRAMIDE HCL 5 MG/ML IJ SOLN: 5.0000 mg | Freq: Three times a day (TID) | INTRAMUSCULAR | Status: DC | PRN

## 2018-11-28 MED ORDER — DEXMEDETOMIDINE HCL IN NACL 200 MCG/50ML IV SOLN
INTRAVENOUS | Status: DC | PRN
  Administered 2018-11-28 (×2): 4 ug via INTRAVENOUS

## 2018-11-28 MED ORDER — FENTANYL CITRATE (PF) 100 MCG/2ML IJ SOLN
INTRAMUSCULAR | Status: AC
  Filled 2018-11-28: qty 2

## 2018-11-28 MED ORDER — MIDAZOLAM HCL 2 MG/2ML IJ SOLN
2.0000 mg | Freq: Once | INTRAMUSCULAR | Status: AC
  Administered 2018-11-28: 2 mg via INTRAVENOUS

## 2018-11-28 MED ORDER — ONDANSETRON HCL 4 MG PO TABS: 4.0000 mg | ORAL_TABLET | Freq: Four times a day (QID) | ORAL | Status: DC | PRN

## 2018-11-28 MED ORDER — MIDAZOLAM HCL 2 MG/2ML IJ SOLN
INTRAMUSCULAR | Status: AC
  Administered 2018-11-28: 2 mg via INTRAVENOUS
  Filled 2018-11-28: qty 2

## 2018-11-28 MED ORDER — METOCLOPRAMIDE HCL 10 MG PO TABS: 5.0000 mg | ORAL_TABLET | Freq: Three times a day (TID) | ORAL | Status: DC | PRN

## 2018-11-28 MED ORDER — SUCCINYLCHOLINE CHLORIDE 20 MG/ML IJ SOLN
INTRAMUSCULAR | Status: DC | PRN
  Administered 2018-11-28: 120 mg via INTRAVENOUS

## 2018-11-28 MED ORDER — EPINEPHRINE PF 1 MG/ML IJ SOLN
INTRAMUSCULAR | Status: AC
  Filled 2018-11-28: qty 1

## 2018-11-28 MED ORDER — ONDANSETRON HCL 4 MG/2ML IJ SOLN
INTRAMUSCULAR | Status: DC | PRN
  Administered 2018-11-28: 4 mg via INTRAVENOUS

## 2018-11-28 MED ORDER — MIDAZOLAM HCL 2 MG/2ML IJ SOLN
INTRAMUSCULAR | Status: DC | PRN
  Administered 2018-11-28: 2 mg via INTRAVENOUS

## 2018-11-28 MED ORDER — EPHEDRINE SULFATE 50 MG/ML IJ SOLN
INTRAMUSCULAR | Status: DC | PRN
  Administered 2018-11-28 (×2): 5 mg via INTRAVENOUS

## 2018-11-28 MED ORDER — ONDANSETRON HCL 4 MG/2ML IJ SOLN: 4.0000 mg | Freq: Four times a day (QID) | INTRAMUSCULAR | Status: DC | PRN

## 2018-11-28 MED ORDER — SODIUM CHLORIDE 0.9 % IV SOLN
INTRAVENOUS | Status: DC
  Administered 2018-11-28 (×2): via INTRAVENOUS

## 2018-11-28 MED ORDER — FENTANYL CITRATE (PF) 100 MCG/2ML IJ SOLN: 25.0000 ug | INTRAMUSCULAR | Status: DC | PRN

## 2018-11-28 MED ORDER — BUPIVACAINE HCL (PF) 0.5 % IJ SOLN
INTRAMUSCULAR | Status: AC
  Filled 2018-11-28: qty 30

## 2018-11-28 MED ORDER — OXYCODONE HCL 5 MG PO TABS: 5.0000 mg | ORAL_TABLET | ORAL | 0 refills | Status: DC | PRN

## 2018-11-28 MED ORDER — FENTANYL CITRATE (PF) 100 MCG/2ML IJ SOLN
50.0000 ug | Freq: Once | INTRAMUSCULAR | Status: AC
  Administered 2018-11-28: 50 ug via INTRAVENOUS

## 2018-11-28 MED ORDER — LIDOCAINE HCL (CARDIAC) PF 100 MG/5ML IV SOSY
PREFILLED_SYRINGE | INTRAVENOUS | Status: DC | PRN
  Administered 2018-11-28: 100 mg via INTRAVENOUS

## 2018-11-28 MED ORDER — BUPIVACAINE LIPOSOME 1.3 % IJ SUSP
INTRAMUSCULAR | Status: AC
  Filled 2018-11-28: qty 20

## 2018-11-28 MED ORDER — BUPIVACAINE HCL (PF) 0.5 % IJ SOLN
INTRAMUSCULAR | Status: AC
  Filled 2018-11-28: qty 10

## 2018-11-28 MED ORDER — SUGAMMADEX SODIUM 500 MG/5ML IV SOLN
INTRAVENOUS | Status: DC | PRN
  Administered 2018-11-28: 350 mg via INTRAVENOUS

## 2018-11-28 MED ORDER — GLYCOPYRROLATE 0.2 MG/ML IJ SOLN
INTRAMUSCULAR | Status: DC | PRN
  Administered 2018-11-28: 0.2 mg via INTRAVENOUS

## 2018-11-28 MED ORDER — ROCURONIUM BROMIDE 50 MG/5ML IV SOLN
INTRAVENOUS | Status: AC
  Filled 2018-11-28: qty 1

## 2018-11-28 MED ORDER — LIDOCAINE HCL (PF) 2 % IJ SOLN
INTRAMUSCULAR | Status: AC
  Filled 2018-11-28: qty 10

## 2018-11-28 MED ORDER — BUPIVACAINE-EPINEPHRINE 0.5% -1:200000 IJ SOLN
INTRAMUSCULAR | Status: DC | PRN
  Administered 2018-11-28: 15 mL

## 2018-11-28 MED ORDER — MIDAZOLAM HCL 2 MG/2ML IJ SOLN
INTRAMUSCULAR | Status: AC
  Filled 2018-11-28: qty 2

## 2018-11-28 MED ORDER — ONDANSETRON HCL 4 MG/2ML IJ SOLN: 4.0000 mg | Freq: Once | INTRAMUSCULAR | Status: DC | PRN

## 2018-11-28 MED ORDER — FAMOTIDINE 20 MG PO TABS
20.0000 mg | ORAL_TABLET | Freq: Once | ORAL | Status: AC
  Administered 2018-11-28: 20 mg via ORAL

## 2018-11-28 MED ORDER — PROPOFOL 10 MG/ML IV BOLUS
INTRAVENOUS | Status: DC | PRN
  Administered 2018-11-28: 200 mg via INTRAVENOUS
  Administered 2018-11-28: 50 mg via INTRAVENOUS

## 2018-11-28 MED ORDER — ROCURONIUM BROMIDE 100 MG/10ML IV SOLN
INTRAVENOUS | Status: DC | PRN
  Administered 2018-11-28: 5 mg via INTRAVENOUS
  Administered 2018-11-28: 45 mg via INTRAVENOUS

## 2018-11-28 SURGICAL SUPPLY — 43 items
BANDAGE ACE 4X5 VEL STRL LF (GAUZE/BANDAGES/DRESSINGS) ×3 IMPLANT
BIT DRILL JUGRKNT W/NDL BIT2.9 (DRILL) IMPLANT
BNDG COHESIVE 4X5 TAN STRL (GAUZE/BANDAGES/DRESSINGS) ×3 IMPLANT
BNDG ESMARK 4X12 TAN STRL LF (GAUZE/BANDAGES/DRESSINGS) ×3 IMPLANT
CANISTER SUCT 1200ML W/VALVE (MISCELLANEOUS) ×3 IMPLANT
CHLORAPREP W/TINT 26ML (MISCELLANEOUS) ×6 IMPLANT
CLOSURE WOUND 1/2 X4 (GAUZE/BANDAGES/DRESSINGS) ×1
COVER WAND RF STERILE (DRAPES) ×3 IMPLANT
CUFF TOURN 18 STER (MISCELLANEOUS) ×3 IMPLANT
CUFF TOURN 24 STER (MISCELLANEOUS) ×3 IMPLANT
DRAPE FLUOR MINI C-ARM 54X84 (DRAPES) ×3 IMPLANT
DRAPE SURG 17X11 SM STRL (DRAPES) ×3 IMPLANT
DRAPE U-SHAPE 47X51 STRL (DRAPES) ×3 IMPLANT
DRILL JUGGERKNOT W/NDL BIT 2.9 (DRILL) ×3
DRSG OPSITE POSTOP 3X4 (GAUZE/BANDAGES/DRESSINGS) ×2 IMPLANT
ELECT REM PT RETURN 9FT ADLT (ELECTROSURGICAL) ×3
ELECTRODE REM PT RTRN 9FT ADLT (ELECTROSURGICAL) ×1 IMPLANT
GAUZE PETRO XEROFOAM 1X8 (MISCELLANEOUS) ×3 IMPLANT
GAUZE SPONGE 4X4 12PLY STRL (GAUZE/BANDAGES/DRESSINGS) ×3 IMPLANT
GLOVE BIO SURGEON STRL SZ8 (GLOVE) ×6 IMPLANT
GLOVE INDICATOR 8.0 STRL GRN (GLOVE) ×3 IMPLANT
GOWN STRL REUS W/ TWL LRG LVL3 (GOWN DISPOSABLE) ×2 IMPLANT
GOWN STRL REUS W/TWL LRG LVL3 (GOWN DISPOSABLE) ×4
IMMOBILIZER SHDR LG LX 900803 (SOFTGOODS) ×2 IMPLANT
KIT TURNOVER KIT A (KITS) ×3 IMPLANT
NS IRRIG 500ML POUR BTL (IV SOLUTION) ×3 IMPLANT
PACK EXTREMITY ARMC (MISCELLANEOUS) ×3 IMPLANT
PAD CAST CTTN 4X4 STRL (SOFTGOODS) ×1 IMPLANT
PADDING CAST 4IN STRL (MISCELLANEOUS) ×4
PADDING CAST BLEND 4X4 STRL (MISCELLANEOUS) ×2 IMPLANT
PADDING CAST COTTON 4X4 STRL (SOFTGOODS) ×2
SLING ARM LRG DEEP (SOFTGOODS) ×3 IMPLANT
STAPLER SKIN PROX 35W (STAPLE) ×2 IMPLANT
STOCKINETTE IMPERV 14X48 (MISCELLANEOUS) ×3 IMPLANT
STRIP CLOSURE SKIN 1/2X4 (GAUZE/BANDAGES/DRESSINGS) ×2 IMPLANT
SUT EXPRESS BRAID BLUE WHT (MISCELLANEOUS) ×2 IMPLANT
SUT MAXBRAID #2 2.9 (Anchor) ×4 IMPLANT
SUT VIC AB 0 CT1 36 (SUTURE) ×2 IMPLANT
SUT VIC AB 2-0 SH 27 (SUTURE) ×2
SUT VIC AB 2-0 SH 27XBRD (SUTURE) ×1 IMPLANT
SUT VIC AB 3-0 SH 27 (SUTURE) ×2
SUT VIC AB 3-0 SH 27X BRD (SUTURE) ×1 IMPLANT
SWABSTK COMLB BENZOIN TINCTURE (MISCELLANEOUS) ×3 IMPLANT

## 2018-11-28 NOTE — Anesthesia Post-op Follow-up Note (Signed)
Anesthesia QCDR form completed.        

## 2018-11-28 NOTE — Transfer of Care (Signed)
Immediate Anesthesia Transfer of Care Note  Patient: Chris Norris  Procedure(s) Performed: OPEN SUBPECTORAL REPAIR OF THE LONG HEAD OF BICEP TENDON-RIGHT SHOULDER (Right )  Patient Location: PACU  Anesthesia Type:General  Level of Consciousness: awake, oriented and patient cooperative  Airway & Oxygen Therapy: Patient Spontanous Breathing and Patient connected to face mask oxygen  Post-op Assessment: Report given to RN and Post -op Vital signs reviewed and stable  Post vital signs: Reviewed and stable  Last Vitals:  Vitals Value Taken Time  BP 121/104 11/28/2018  2:04 PM  Temp    Pulse 87 11/28/2018  2:06 PM  Resp 35 11/28/2018  2:06 PM  SpO2 97 % 11/28/2018  2:06 PM  Vitals shown include unvalidated device data.  Last Pain:  Vitals:   11/28/18 1224  TempSrc:   PainSc: 0-No pain         Complications: No apparent anesthesia complications

## 2018-11-28 NOTE — Op Note (Signed)
11/28/2018  1:48 PM  Patient:   Chris Norris  Pre-Op Diagnosis:   Rupture of long head biceps tendon, right shoulder.  Post-Op Diagnosis:   Same  Procedure:   Subpectoralis biceps tenodesis, right shoulder.  Anesthesia:   General endotracheal with interscalene block using Exparel placed preoperatively by the anesthesiologist.  Surgeon:   Pascal Lux, MD  Assistant:   Morley Kos, PA-C; Orland Penman, PA-S  Findings:   As above.   Complications:   None  Fluids:   900 cc  Estimated blood loss:   10 cc  Tourniquet time:   None  Drains:   None  Closure:   Staples      Brief clinical note:   The patient is a 58 year old male who sustained above-noted injury nearly 4 months ago when he hurt himself lifting a heavy roll at work. The patient's symptoms have progressed despite medications, activity modification, etc. The patient's history and examination are consistent with an acute rupture of the long head of the biceps tendon. These findings were confirmed by MRI scan which showed no evidence of rotator cuff pathology. The patient presents at this time for a subpectoralis biceps tenodesis.  Procedure:   The patient underwent placement of an interscalene block using Exparel by the anesthesiologist in the preoperative holding area before being brought into the operating room and lain in the supine position. The patient then underwent general endotracheal intubation and anesthesia before being repositioned in the beach chair position using the beach chair positioner. The right shoulder and upper extremity were prepped with ChloraPrep solution before being draped sterilely. Preoperative antibiotics were administered. A timeout was performed to confirm the proper surgical site. An approximately 4-5 cm incision was made over the anteromedial aspect of the shoulder just lateral to the axilla and the area just below the pectoralis muscle and tendon. This incision was carried down through the  subcutaneous tissues to expose the fascia overlying the biceps and pectoralis muscles.   The inferior border of the pectoralis tendon was identified and dissected down to the humerus. The bicipital groove was identified, as was the biceps tendon. This was dissected free and its identity verified before it was transected as far proximally as possible. The tendon was advanced proximally to determine the appropriate length for reattachment and transected at this level. The tendon stump was whipstitched with a #2 MaxBraid suture before the tear was repaired using two Biomet 2.9 mm JuggerLoc anchors. Each of these anchors were positioned at the distal end of the bicipital groove approximately 1.5 cm apart from each other these sutures were then cinched tight to secure the biceps tendon.  The wound was copiously irrigated with sterile saline solution before the fascial layer was reapproximated using #0 Vicryl interrupted sutures. The subcutaneous tissues were closed in two layers using 2-0 Vicryl interrupted sutures before the skin was closed using staples. A sterile occlusive dressing was applied to the shoulder before the arm was placed into a shoulder immobilizer. The patient was then awakened, extubated, and returned to the recovery room in satisfactory condition after tolerating the procedure well.

## 2018-11-28 NOTE — Anesthesia Procedure Notes (Signed)
Procedure Name: Intubation Date/Time: 11/28/2018 12:46 PM Performed by: Kelton Pillar, CRNA Pre-anesthesia Checklist: Patient identified, Emergency Drugs available, Suction available and Patient being monitored Patient Re-evaluated:Patient Re-evaluated prior to induction Oxygen Delivery Method: Circle system utilized Preoxygenation: Pre-oxygenation with 100% oxygen Induction Type: IV induction Ventilation: Mask ventilation without difficulty Laryngoscope Size: McGraph and 3 Grade View: Grade I Tube type: Oral Tube size: 7.0 mm Number of attempts: 1 Airway Equipment and Method: Stylet Placement Confirmation: ETT inserted through vocal cords under direct vision,  positive ETCO2,  CO2 detector and breath sounds checked- equal and bilateral Secured at: 22 cm Tube secured with: Tape Dental Injury: Teeth and Oropharynx as per pre-operative assessment

## 2018-11-28 NOTE — Anesthesia Preprocedure Evaluation (Addendum)
Anesthesia Evaluation  Patient identified by MRN, date of birth, ID band Patient awake    Reviewed: Allergy & Precautions, NPO status , Patient's Chart, lab work & pertinent test results  Airway Mallampati: III  TM Distance: >3 FB     Dental   Pulmonary sleep apnea ,    Pulmonary exam normal        Cardiovascular + CAD and + Past MI  Normal cardiovascular exam     Neuro/Psych Anxiety negative neurological ROS     GI/Hepatic negative GI ROS, Neg liver ROS,   Endo/Other  diabetes  Renal/GU negative Renal ROS  negative genitourinary   Musculoskeletal   Abdominal Normal abdominal exam  (+)   Peds negative pediatric ROS (+)  Hematology negative hematology ROS (+)   Anesthesia Other Findings Past Medical History: No date: Coronary artery disease No date: Diabetes mellitus without complication ([facility]) No date: Heart disease No date: Myocardial infarction ([facility])     Comment:  X 3 STENTS LAST 10 YEARS AGO No date: Sleep apnea in adult  Reproductive/Obstetrics                             Anesthesia Physical Anesthesia Plan  ASA: III  Anesthesia Plan: General   Post-op Pain Management:  Regional for Post-op pain   Induction: Intravenous  PONV Risk Score and Plan:   Airway Management Planned: Oral ETT  Additional Equipment:   Intra-op Plan:   Post-operative Plan: Extubation in OR  Informed Consent: I have reviewed the patients History and Physical, chart, labs and discussed the procedure including the risks, benefits and alternatives for the proposed anesthesia with the patient or authorized representative who has indicated his/her understanding and acceptance.     Dental advisory given  Plan Discussed with: CRNA and Surgeon  Anesthesia Plan Comments: (Interscalene block was discussed with the patient and he wishes to proceed... he accepts the risks which include pneumothorax,  infection, nerve damage, difficulty breathing and the block not working well.)       Anesthesia Quick Evaluation

## 2018-11-28 NOTE — Discharge Instructions (Addendum)
Interscalene Nerve Block with Exparel  1.  For your surgery you have received an Interscalene Nerve Block with Exparel. 2. Nerve Blocks affect many types of nerves, including nerves that control movement, pain and normal sensation.  You may experience feelings such as numbness, tingling, heaviness, weakness or the inability to move your arm or the feeling or sensation that your arm has "fallen asleep". 3. A nerve block with Exparel can last up to 5 days.  Usually the weakness wears off first.  The tingling and heaviness usually wear off next.  Finally you may start to notice pain.  Keep in mind that this may occur in any order.  Once a nerve block starts to wear off it is usually completely gone within 60 minutes. 4. ISNB may cause mild shortness of breath, a hoarse voice, blurry vision, unequal pupils, or drooping of the face on the same side as the nerve block.  These symptoms will usually resolve with the numbness.  Very rarely the procedure itself can cause mild seizures. 5. If needed, your surgeon will give you a prescription for pain medication.  It will take about 60 minutes for the oral pain medication to become fully effective.  So, it is recommended that you start taking this medication before the nerve block first begins to wear off, or when you first begin to feel discomfort. 6. Take your pain medication only as prescribed.  Pain medication can cause sedation and decrease your breathing if you take more than you need for the level of pain that you have. 7. Nausea is a common side effect of many pain medications.  You may want to eat something before taking your pain medicine to prevent nausea. 8. After an Interscalene nerve block, you cannot feel pain, pressure or extremes in temperature in the effected arm.  Because your arm is numb it is at an increased risk for injury.  To decrease the possibility of injury, please practice the following:  a. While you are awake change the position of  your arm frequently to prevent too much pressure on any one area for prolonged periods of time. b.  If you have a cast or tight dressing, check the color or your fingers every couple of hours.  Call your surgeon with the appearance of any discoloration (white or blue). c. If you are given a sling to wear before you go home, please wear it  at all times until the block has completely worn off.  Do not get up at night without your sling. d. Please contact [facility] or your surgeon if you do not begin to regain sensation after 7 days from the surgery.  Anesthesia may be contacted by calling the Same Day Surgery Department, Mon. through Fri., 6 am to 4 pm at [phone].   e. If you experience any other problems or concerns, please contact your surgeon's office. If you experience severe or prolonged shortness of breath go to the nearest emergency department.  AMBULATORY SURGERY  DISCHARGE INSTRUCTIONS   1) The drugs that you were given will stay in your system until tomorrow so for the next 24 hours you should not:  A) Drive an automobile B) Make any legal decisions C) Drink any alcoholic beverage   2) You may resume regular meals tomorrow.  Today it is better to start with liquids and gradually work up to solid foods.  You may eat anything you prefer, but it is better to start with liquids, then soup and crackers,  and gradually work up to solid foods.   3) Please notify your doctor immediately if you have any unusual bleeding, trouble breathing, redness and pain at the surgery site, drainage, fever, or pain not relieved by medication.    4) Additional Instructions: Hospital main number [phone] #5       .Orthopedic discharge instructions: Keep dressing dry and intact.  May shower after dressing changed on post-op day #4 (Monday).  Cover staples with Band-Aids after drying off. Apply ice frequently to shoulder. Take Diclofenac 100 mg daily OR ibuprofen 800 mg TID with  meals for 7-10 days, then as necessary. Take oxycodone as prescribed when needed.  May supplement with ES Tylenol if necessary. Keep shoulder immobilizer on at all times except may remove for bathing purposes. Follow-up in 10-14 days or as scheduled.

## 2018-11-28 NOTE — H&P (Signed)
Paper H&P to be scanned into permanent record. H&P reviewed and patient re-examined. No changes. 

## 2018-11-29 NOTE — Anesthesia Procedure Notes (Signed)
Anesthesia Regional Block: Interscalene brachial plexus block   Pre-Anesthetic Checklist: ,, timeout performed, Correct Patient, Correct Site, Correct Laterality, Correct Procedure, Correct Position, site marked, Risks and benefits discussed,  Surgical consent,  Pre-op evaluation,  At surgeon's request and post-op pain management  Laterality: Right  Prep: chloraprep, alcohol swabs       Needles:  Injection technique: Single-shot  Needle Type: Stimiplex     Needle Length: 5cm  Needle Gauge: 22     Additional Needles:   Procedures:, nerve stimulator,,, ultrasound used (permanent image in chart),,,,   Nerve Stimulator or Paresthesia:  Response: biceps flexion,   Additional Responses:   Narrative:  Start time: 11/28/2018 11:50 AM End time: 11/28/2018 12:00 PM Injection made incrementally with aspirations every 5 mL.  Performed by: Personally   Additional Notes: Time out was called.  The patient was placed in a semisitting position.  The right neck was prepped and draped in sterile fashion.  The [place] was used to find the nerve bundle around the C6 position.  A skin wheal was made lateral to the probe with 1% Lidocaine plain.  A stimuplex needle was advanced lateral to the bundle and a biceps twitch was obtained which faded around 0.24mAmps.  Easy , incremental injection of a mix of 0.5% Marcaine plain and 20 cc of exparel.  No pain on injection and easy injection.  The patient tolerated the procedure well.

## 2018-11-29 NOTE — Anesthesia Postprocedure Evaluation (Signed)
Anesthesia Post Note  Patient: Chris Norris  Procedure(s) Performed: OPEN SUBPECTORAL REPAIR OF THE LONG HEAD OF BICEP TENDON-RIGHT SHOULDER (Right )  Patient location during evaluation: PACU Anesthesia Type: General Level of consciousness: awake and alert and oriented Pain management: pain level controlled Vital Signs Assessment: post-procedure vital signs reviewed and stable Respiratory status: spontaneous breathing Cardiovascular status: blood pressure returned to baseline Anesthetic complications: no     Last Vitals:  Vitals:   11/28/18 1446 11/28/18 1512  BP: 122/84 125/82  Pulse: 76 73  Resp: 18 16  Temp: 36.5 C   SpO2: 97% 97%    Last Pain:  Vitals:   11/28/18 1446  TempSrc: Oral  PainSc: 0-No pain                 Ariadna Setter

## 2018-12-01 ENCOUNTER — Encounter: Payer: Self-pay | Admitting: Surgery

## 2019-01-18 ENCOUNTER — Emergency Department
Admission: EM | Admit: 2019-01-18 | Discharge: 2019-01-18 | Disposition: A | Discharge status: Home / Self Care | Payer: BLUE CROSS/BLUE SHIELD | Attending: Pediatrics | Admitting: Pediatrics

## 2019-01-18 ENCOUNTER — Emergency Department: Payer: BLUE CROSS/BLUE SHIELD

## 2019-01-18 ENCOUNTER — Other Ambulatory Visit: Payer: Self-pay

## 2019-01-18 DIAGNOSIS — E119 Type 2 diabetes mellitus without complications: Secondary | ICD-10-CM | POA: Insufficient documentation

## 2019-01-18 DIAGNOSIS — I2510 Atherosclerotic heart disease of native coronary artery without angina pectoris: Secondary | ICD-10-CM | POA: Insufficient documentation

## 2019-01-18 DIAGNOSIS — Z20828 Contact with and (suspected) exposure to other viral communicable diseases: Secondary | ICD-10-CM | POA: Diagnosis not present

## 2019-01-18 DIAGNOSIS — Z79899 Other long term (current) drug therapy: Secondary | ICD-10-CM | POA: Diagnosis not present

## 2019-01-18 DIAGNOSIS — R0602 Shortness of breath: Secondary | ICD-10-CM | POA: Diagnosis not present

## 2019-01-18 DIAGNOSIS — B349 Viral infection, unspecified: Secondary | ICD-10-CM | POA: Diagnosis not present

## 2019-01-18 LAB — COMPREHENSIVE METABOLIC PANEL
ALT: 33 U/L (ref 0–44)
AST: 22 U/L (ref 15–41)
Albumin: 4 g/dL (ref 3.5–5.0)
Alkaline Phosphatase: 71 U/L (ref 38–126)
Anion gap: 9 (ref 5–15)
BUN: 31 mg/dL — ABNORMAL HIGH (ref 6–20)
CO2: 24 mmol/L (ref 22–32)
Calcium: 9 mg/dL (ref 8.9–10.3)
Chloride: 107 mmol/L (ref 98–111)
Creatinine, Ser: 0.78 mg/dL (ref 0.61–1.24)
GFR calc Af Amer: >60 mL/min (ref >60)
GFR calc non Af Amer: >60 mL/min (ref >60)
Glucose, Bld: 166 mg/dL — ABNORMAL HIGH (ref 70–99)
Potassium: 4 mmol/L (ref 3.5–5.1)
Sodium: 140 mmol/L (ref 135–145)
Total Bilirubin: 0.6 mg/dL (ref 0.3–1.2)
Total Protein: 6.7 g/dL (ref 6.5–8.1)

## 2019-01-18 LAB — CBC WITH DIFFERENTIAL/PLATELET
Abs Immature Granulocytes: 0.04 10*3/uL (ref 0.00–0.07)
Basophils Absolute: 0.1 10*3/uL (ref 0.0–0.1)
Basophils Relative: 1 %
Eosinophils Absolute: 0.2 10*3/uL (ref 0.0–0.5)
Eosinophils Relative: 2 %
HCT: 47.7 % (ref 39.0–52.0)
Hemoglobin: 16.7 g/dL (ref 13.0–17.0)
Immature Granulocytes: 0 %
Lymphocytes Relative: 19 %
Lymphs Abs: 2.2 10*3/uL (ref 0.7–4.0)
MCH: 31.7 pg (ref 26.0–34.0)
MCHC: 35 g/dL (ref 30.0–36.0)
MCV: 90.5 fL (ref 80.0–100.0)
Monocytes Absolute: 0.8 10*3/uL (ref 0.1–1.0)
Monocytes Relative: 7 %
Neutro Abs: 8.2 10*3/uL — ABNORMAL HIGH (ref 1.7–7.7)
Neutrophils Relative %: 71 %
Platelets: 265 10*3/uL (ref 150–400)
RBC: 5.27 MIL/uL (ref 4.22–5.81)
RDW: 13.1 % (ref 11.5–15.5)
WBC: 11.5 10*3/uL — ABNORMAL HIGH (ref 4.0–10.5)
nRBC: 0 % (ref 0.0–0.2)

## 2019-01-18 LAB — SARS CORONAVIRUS 2 BY RT PCR (HOSPITAL ORDER, PERFORMED IN ~~LOC~~ HOSPITAL LAB): SARS Coronavirus 2: NEGATIVE

## 2019-01-18 LAB — TROPONIN I: Troponin I: <0.03 ng/mL (ref <0.03)

## 2019-01-18 MED ORDER — AZITHROMYCIN 250 MG PO TABS: ORAL_TABLET | ORAL | 0 refills | Status: DC

## 2019-01-18 NOTE — ED Provider Notes (Signed)
[facility] Emergency Department Provider Note  ____________________________________________   Norris    (approximate)  I have reviewed the triage vital signs and the nursing notes.   HISTORY  Chief Complaint Shortness of Breath   HPI Chris Norris is a 58 y.o. male who presents to the emergency department for treatment and evaluation of cough, shortness of breath, vomiting, and body aches since yesterday. A person at work has tested positive for COVID-19. No alleviating measures prior to arrival. Patient has a history of CAD, MI, and DM. He denies chest pain or pressure.    Past Medical History:  Diagnosis Date  . Coronary artery disease   . Diabetes mellitus without complication (Chris Norris)   . Heart disease   . Myocardial infarction (HCC)    X 3 STENTS LAST 10 YEARS AGO  . Sleep apnea in adult     Patient Active Problem List   Diagnosis Date Noted  . Secondary polycythemia 12/28/2017  . 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 Anxiety 08/11/2014  . Coronary artery disease 08/11/2014    Past Surgical History:  Procedure Laterality Date  . BICEPS TENDON REPAIR    . BICEPS TENDON REPAIR Right 11/28/2018   Procedure: OPEN SUBPECTORAL REPAIR OF THE LONG HEAD OF BICEP TENDON-RIGHT SHOULDER;  Surgeon: Corky Mull, MD;  Location: [facility] ORS;  Service: Orthopedics;  Laterality: Right;  . CORONARY ANGIOPLASTY     X 3  . KNEE ARTHROSCOPY W/ MENISCAL REPAIR    . STENT PLACEMENT VASCULAR ([facility] HX)     X 3 LAST 10 YEARS AGO  . TRICEPS TENDON REPAIR      Prior to Admission medications   Medication Sig Start Date End Date Taking? Authorizing Provider  aspirin EC 81 MG tablet Take 81 mg by mouth 2 (two) times daily.    [provider]  azithromycin (ZITHROMAX) 250 MG tablet 2 tablets today, then 1 tablet for the next 4 days. 01/18/19   Allyssa Abruzzese, Johnette Abraham B, FNP  Diclofenac Sodium CR 100 MG 24 hr tablet Take 100 mg by mouth daily.    [provider]   Evolocumab (REPATHA) 140 MG/ML SOSY Inject 140 mg into the skin every 14 (fourteen) days.    [provider]  Icosapent Ethyl (VASCEPA) 1 g CAPS Take 2 g by mouth every morning.    [provider]  insulin aspart (NOVOLOG) 100 UNIT/ML injection Inject 0-10 Units into the skin See admin instructions. FOR HIGH BLOOD SUGAR  USUALLY 2 X MONTH    [provider]  Insulin Glargine (TOUJEO MAX SOLOSTAR) 300 UNIT/ML SOPN Inject 10 Units into the skin 2 (two) times daily.    [provider]  oxyCODONE (ROXICODONE) 5 MG immediate release tablet Take 1-2 tablets (5-10 mg total) by mouth every 4 (four) hours as needed. 11/28/18   Poggi, Marshall Cork, MD    Allergies Patient has no known allergies.  History reviewed. No pertinent family history.  Social History Social History   Tobacco Use  . Smoking status: Never Smoker  . Smokeless tobacco: Never Used  . Tobacco comment: occasional cigars  Substance Use Topics  . Alcohol use: Not Currently  . Drug use: Never    Review of Systems  Constitutional: No fever/chills Eyes: No visual changes. ENT: No sore throat. Cardiovascular: Denies chest pain. Respiratory: Positive for shortness of breath. Gastrointestinal: No abdominal pain. Positive for nausea and vomiting. No diarrhea.  No constipation. Genitourinary: Negative for dysuria. Musculoskeletal: Negative for back  pain. Skin: Negative for rash. Neurological: Negative for headaches, focal weakness or numbness. ____________________________________________   PHYSICAL EXAM:  VITAL SIGNS: ED Triage Vitals [01/18/19 1144]  Enc Vitals Group     BP (!) 150/82     Pulse Rate (!) 105     Resp 18     Temp 98 F (36.7 C)     Temp Source Oral     SpO2 95 %     Weight 190 lb (86.2 kg)     Height 5\' 10"  (1.778 m)     Head Circumference      Peak Flow      Pain Score 3     Pain Loc      Pain Edu?      Excl. in [place]?     Constitutional: Alert and oriented. Well  appearing and in no acute distress. Eyes: Conjunctivae are normal. PERRL. EOMI. Head: Atraumatic. Nose: No congestion/rhinnorhea. Mouth/Throat: Mucous membranes are moist.  Oropharynx non-erythematous. Neck: No stridor.   Cardiovascular: Normal rate, regular rhythm. Grossly normal heart sounds.  Good peripheral circulation. Respiratory: Normal respiratory effort.  No retractions. Lungs CTAB. Gastrointestinal: Soft and nontender. No distention. No abdominal bruits. No CVA tenderness. Musculoskeletal: No lower extremity tenderness nor edema.  No joint effusions. Neurologic:  Normal speech and language. No gross focal neurologic deficits are appreciated. No gait instability. Skin:  Skin is warm, dry and intact. No rash noted. Psychiatric: Mood and affect are normal. Speech and behavior are normal.  ____________________________________________   LABS (all labs ordered are listed, but only abnormal results are displayed)  Labs Reviewed  CBC WITH DIFFERENTIAL/PLATELET - Abnormal; Notable for the following components:      Result Value   WBC 11.5 (*)    Neutro Abs 8.2 (*)    All other components within normal limits  COMPREHENSIVE METABOLIC PANEL - Abnormal; Notable for the following components:   Glucose, Bld 166 (*)    BUN 31 (*)    All other components within normal limits  SARS CORONAVIRUS 2 (HOSPITAL ORDER, [facility])  TROPONIN I   ____________________________________________  EKG  ED ECG REPORT I, Kienna Moncada, FNP-BC personally viewed and interpreted this ECG.   Date: 01/18/2019  EKG Time: 1159  Rate: 101  Rhythm: sinus tachycardia  Axis: normal  Intervals:Norris  ST&T Change: no ST elevation  ____________________________________________  RADIOLOGY  ED MD interpretation:  No acute cardiopulmonary abnormality per radiology.  Official radiology report(s): Dg Chest Portable 1 View  Result Date: 01/18/2019 CLINICAL DATA:  Cough with  vomiting, body aches and shortness of breath for 3 days. Recent contact with COVID-19 patient. History of diabetes and myocardial infarction. EXAM: PORTABLE CHEST 1 VIEW COMPARISON:  Radiograph 03/07/2017. FINDINGS: 1205 hours. The heart size and mediastinal contours are stable. There is stable coarsening of the interstitial markings at the lung bases. No confluent airspace opacity, edema, pleural effusion or pneumothorax demonstrated. The bones appear unremarkable. Telemetry leads overlie the chest. IMPRESSION: Stable mild chronic interstitial thickening. No acute cardiopulmonary process identified. Electronically Signed   By: Richardean Sale M.D.   On: 01/18/2019 12:37    ____________________________________________   PROCEDURES  Procedure(s) performed: Norris  Procedures  Critical Care performed: No  ____________________________________________   INITIAL IMPRESSION / ASSESSMENT AND PLAN / ED COURSE  58 year old male presents to the emergency department for treatment and evaluation of shortness of breath, generalized malaise, cough, and vomiting in the setting of exposure to COVID 19.  Exam is reassuring. Because of his cardiac and diabetic history with vomiting and shortness of breath labs, chest x-ray, and COVID screening will be done.  ----------------------------------------- 2:30 PM on 01/18/2019 -----------------------------------------  Patient to be discharged home with quarantine instructions. He will also be prescribed a z-pack. He is to schedule a telehealth visit with primary care for concerns. He is to return to the ER for worsening shortness of breath or chest pain.   EDWARDO WOJNAROWSKI was evaluated in Emergency Department on 01/18/2019 for the symptoms described in the history of present illness. He was evaluated in the context of the global COVID-19 pandemic, which necessitated consideration that the patient might be at risk for infection with the SARS-CoV-2 virus that causes  COVID-19. Institutional protocols and algorithms that pertain to the evaluation of patients at risk for COVID-19 are in a state of rapid change based on information released by regulatory bodies including the CDC and federal and state organizations. These policies and algorithms were followed during the patient's care in the ED.       ____________________________________________   FINAL CLINICAL IMPRESSION(S) / ED DIAGNOSES  Final diagnoses:  SOB (shortness of breath)  Acute viral syndrome     ED Discharge Orders         Ordered    azithromycin (ZITHROMAX) 250 MG tablet     01/18/19 1427           Note:  This document was prepared using Dragon voice recognition software and may include unintentional dictation errors.    Victorino Dike, FNP 01/18/19 1431    Nance Pear, MD 01/19/19 [phone]

## 2019-01-18 NOTE — ED Triage Notes (Signed)
Pt states he has been in contact with a COVID + person. States cough, vomit, body aches, and SOB. Mask on. A&O, ambulatory.

## 2019-01-18 NOTE — Discharge Instructions (Signed)
Please schedule a Tele Health visit with primary care for concerns.  Return to the ER if the shortness of breath worsens.  Quarantine yourself for the next 7 days or until you have been fever free for 3 full days without tylenol or ibuprofen.

## 2019-03-20 ENCOUNTER — Other Ambulatory Visit
Admission: RE | Admit: 2019-03-20 | Discharge: 2019-03-20 | Disposition: A | Discharge status: Home / Self Care | Payer: BC Managed Care – PPO | Source: Ambulatory Visit | Attending: Rehabilitative and Restorative Service Providers" | Admitting: Rehabilitative and Restorative Service Providers"

## 2019-03-20 DIAGNOSIS — I2510 Atherosclerotic heart disease of native coronary artery without angina pectoris: Secondary | ICD-10-CM | POA: Insufficient documentation

## 2019-03-20 DIAGNOSIS — R079 Chest pain, unspecified: Secondary | ICD-10-CM | POA: Insufficient documentation

## 2019-03-20 LAB — TROPONIN I (HIGH SENSITIVITY): Troponin I (High Sensitivity): <2 ng/L (ref <18)

## 2019-06-04 ENCOUNTER — Other Ambulatory Visit: Payer: Self-pay

## 2019-06-04 ENCOUNTER — Ambulatory Visit: Payer: Self-pay | Admitting: Physician Assistant

## 2019-06-04 DIAGNOSIS — Z113 Encounter for screening for infections with a predominantly sexual mode of transmission: Secondary | ICD-10-CM

## 2019-06-04 DIAGNOSIS — Z202 Contact with and (suspected) exposure to infections with a predominantly sexual mode of transmission: Secondary | ICD-10-CM

## 2019-06-04 MED ORDER — AZITHROMYCIN 500 MG PO TABS
1000.0000 mg | ORAL_TABLET | Freq: Once | ORAL | Status: AC
  Administered 2019-06-04: 18:00:00 1000 mg via ORAL

## 2019-06-04 MED ORDER — METRONIDAZOLE 500 MG PO TABS: 500.0000 mg | ORAL_TABLET | Freq: Once | ORAL | 0 refills | Status: AC

## 2019-06-04 MED ORDER — CEFTRIAXONE SODIUM 250 MG IJ SOLR
250.0000 mg | Freq: Once | INTRAMUSCULAR | Status: AC
  Administered 2019-06-04: 250 mg via INTRAMUSCULAR

## 2019-06-05 ENCOUNTER — Encounter: Payer: Self-pay | Admitting: Physician Assistant

## 2019-06-05 NOTE — Progress Notes (Signed)
    STI clinic/screening visit  Subjective:  Chris Norris is a 58 y.o. male being seen today for an STI screening visit. The patient reports they do not have symptoms.  Patient has the following medical conditions:   Patient Active Problem List   Diagnosis Date Noted  . Secondary polycythemia 12/28/2017  . 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 Anxiety 08/11/2014  . Coronary artery disease 08/11/2014     Chief Complaint  Patient presents with  . SEXUALLY TRANSMITTED DISEASE    HPI  Patient reports that he is a contact to [place].  Denies any symptoms.  Patient very upset and adamant about receiving treatment to cover him for GC, Chlamydia and Trich today.  States that he does not want to take any chances.  Declines screening physical exam today but accepts urine GC/Chlamydia and RPR today.   See flowsheet for further details and programmatic requirements.    The following portions of the patient's history were reviewed and updated as appropriate: allergies, current medications, past medical history, past social history, past surgical history and problem list.  Objective:  There were no vitals filed for this visit.  Physical Exam Constitutional:      General: He is not in acute distress.    Appearance: Normal appearance.  HENT:     Head: Normocephalic and atraumatic.     Mouth/Throat:     Mouth: Mucous membranes are moist.     Pharynx: Oropharynx is clear. No oropharyngeal exudate or posterior oropharyngeal erythema.  Eyes:     Conjunctiva/sclera: Conjunctivae normal.  Pulmonary:     Effort: Pulmonary effort is normal.  Neurological:     Mental Status: He is alert and oriented to person, place, and time.  Psychiatric:        Mood and Affect: Mood normal.        Behavior: Behavior normal.        Thought Content: Thought content normal.        Judgment: Judgment normal.    Declines physical exam today.    Assessment and Plan:  Chris Norris is a 58 y.o. male  presenting to the [facility] Department for STI screening  1. Screening for STD (sexually transmitted disease) Patient is without symptoms and is a contact to [place]. Rec condoms with all sex Await test results.  Counseled that RN will call if needs to RTC for further treatment once results are back.  - Ct, Ng, Mycoplasmas NAA, Urine - Syphilis Serology, [place] [facility]  2. Venereal disease contact Patient insists on treatment to cover for GC and Chlamydia today as well as treatment as a contact to Trich due to CAD and DM. Will treat as a contact to Trich with Metronidazole 2 g po at one time with food, no EtOH for 24 hr before and until 72 hr after taking medicine. Will treat to cover for GC and Chlamydia with Azithromycin 1 g po DOT and Ceftriaxone 250mg  IM today No sex for 7 days and until after partner completes treatment. Enc to follow up with PCP to see if has been screened for Hep C due to age. Rec if vomits < 2 hr after taking medicine. - metroNIDAZOLE (FLAGYL) 500 MG tablet;  Take 4 tablets by mouth once for 1 dose  Dispense: 4 tablet; Refill: 0 - azithromycin (ZITHROMAX) tablet 1,000 mg - cefTRIAXone (ROCEPHIN) injection 250 mg     No follow-ups on file.  No future appointments.  Jerene Dilling, PA

## 2019-06-08 LAB — CT, NG, MYCOPLASMAS NAA, URINE
Chlamydia trachomatis, NAA: NEGATIVE
Mycoplasma genitalium NAA: NEGATIVE
Mycoplasma hominis NAA: NEGATIVE
Neisseria gonorrhoeae, NAA: NEGATIVE
Ureaplasma spp NAA: POSITIVE — AB

## 2019-06-10 LAB — QUANTIFERON-TB GOLD PLUS
QuantiFERON Mitogen Value: >10 IU/mL
QuantiFERON Nil Value: 0.01 IU/mL
QuantiFERON TB1 Ag Value: 0.02 IU/mL
QuantiFERON TB2 Ag Value: 0.02 IU/mL
QuantiFERON-TB Gold Plus: NEGATIVE

## 2019-07-06 ENCOUNTER — Encounter: Payer: Self-pay | Admitting: Emergency Medicine

## 2019-07-06 ENCOUNTER — Other Ambulatory Visit: Payer: Self-pay

## 2019-07-06 DIAGNOSIS — Z794 Long term (current) use of insulin: Secondary | ICD-10-CM

## 2019-07-06 DIAGNOSIS — I252 Old myocardial infarction: Secondary | ICD-10-CM

## 2019-07-06 DIAGNOSIS — I214 Non-ST elevation (NSTEMI) myocardial infarction: Principal | ICD-10-CM | POA: Diagnosis present

## 2019-07-06 DIAGNOSIS — Z7982 Long term (current) use of aspirin: Secondary | ICD-10-CM

## 2019-07-06 DIAGNOSIS — I509 Heart failure, unspecified: Secondary | ICD-10-CM | POA: Diagnosis present

## 2019-07-06 DIAGNOSIS — Z79891 Long term (current) use of opiate analgesic: Secondary | ICD-10-CM

## 2019-07-06 DIAGNOSIS — F17290 Nicotine dependence, other tobacco product, uncomplicated: Secondary | ICD-10-CM | POA: Diagnosis present

## 2019-07-06 DIAGNOSIS — G4730 Sleep apnea, unspecified: Secondary | ICD-10-CM | POA: Diagnosis present

## 2019-07-06 DIAGNOSIS — I110 Hypertensive heart disease with heart failure: Secondary | ICD-10-CM | POA: Diagnosis present

## 2019-07-06 DIAGNOSIS — I25110 Atherosclerotic heart disease of native coronary artery with unstable angina pectoris: Secondary | ICD-10-CM | POA: Diagnosis present

## 2019-07-06 DIAGNOSIS — E119 Type 2 diabetes mellitus without complications: Secondary | ICD-10-CM | POA: Diagnosis present

## 2019-07-06 DIAGNOSIS — Z955 Presence of coronary angioplasty implant and graft: Secondary | ICD-10-CM

## 2019-07-06 DIAGNOSIS — E785 Hyperlipidemia, unspecified: Secondary | ICD-10-CM | POA: Diagnosis present

## 2019-07-06 DIAGNOSIS — I5022 Chronic systolic (congestive) heart failure: Secondary | ICD-10-CM | POA: Diagnosis present

## 2019-07-06 DIAGNOSIS — G4733 Obstructive sleep apnea (adult) (pediatric): Secondary | ICD-10-CM | POA: Diagnosis present

## 2019-07-06 DIAGNOSIS — Z20828 Contact with and (suspected) exposure to other viral communicable diseases: Secondary | ICD-10-CM | POA: Diagnosis present

## 2019-07-06 DIAGNOSIS — Z79899 Other long term (current) drug therapy: Secondary | ICD-10-CM

## 2019-07-06 DIAGNOSIS — F419 Anxiety disorder, unspecified: Secondary | ICD-10-CM | POA: Diagnosis present

## 2019-07-06 NOTE — ED Triage Notes (Signed)
Pt arrives POV to triage with c/o mid chest pain for about an hour. Pt has had x3 previous MI's and stents.

## 2019-07-07 ENCOUNTER — Inpatient Hospital Stay
Admission: EM | Admit: 2019-07-07 | Discharge: 2019-07-09 | DRG: 247 | Payer: BC Managed Care – PPO | Attending: Internal Medicine | Admitting: Internal Medicine

## 2019-07-07 ENCOUNTER — Emergency Department: Payer: BC Managed Care – PPO

## 2019-07-07 ENCOUNTER — Other Ambulatory Visit: Payer: Self-pay

## 2019-07-07 ENCOUNTER — Inpatient Hospital Stay
Admit: 2019-07-07 | Discharge: 2019-07-07 | Disposition: A | Discharge status: Home / Self Care | Payer: BC Managed Care – PPO | Attending: Nurse Practitioner | Admitting: Nurse Practitioner

## 2019-07-07 ENCOUNTER — Encounter
Admission: EM | Disposition: A | Discharge status: Other Acute Inpatient Hospital | Payer: Self-pay | Source: Home / Self Care | Attending: Internal Medicine

## 2019-07-07 DIAGNOSIS — E119 Type 2 diabetes mellitus without complications: Secondary | ICD-10-CM | POA: Diagnosis present

## 2019-07-07 DIAGNOSIS — Z20828 Contact with and (suspected) exposure to other viral communicable diseases: Secondary | ICD-10-CM | POA: Diagnosis present

## 2019-07-07 DIAGNOSIS — I110 Hypertensive heart disease with heart failure: Secondary | ICD-10-CM | POA: Diagnosis present

## 2019-07-07 DIAGNOSIS — I214 Non-ST elevation (NSTEMI) myocardial infarction: Secondary | ICD-10-CM | POA: Diagnosis present

## 2019-07-07 DIAGNOSIS — Z7982 Long term (current) use of aspirin: Secondary | ICD-10-CM | POA: Diagnosis not present

## 2019-07-07 DIAGNOSIS — I509 Heart failure, unspecified: Secondary | ICD-10-CM | POA: Diagnosis present

## 2019-07-07 DIAGNOSIS — I5022 Chronic systolic (congestive) heart failure: Secondary | ICD-10-CM | POA: Diagnosis present

## 2019-07-07 DIAGNOSIS — F419 Anxiety disorder, unspecified: Secondary | ICD-10-CM | POA: Diagnosis present

## 2019-07-07 DIAGNOSIS — I25110 Atherosclerotic heart disease of native coronary artery with unstable angina pectoris: Secondary | ICD-10-CM | POA: Diagnosis present

## 2019-07-07 DIAGNOSIS — R079 Chest pain, unspecified: Secondary | ICD-10-CM

## 2019-07-07 DIAGNOSIS — Z79899 Other long term (current) drug therapy: Secondary | ICD-10-CM | POA: Diagnosis not present

## 2019-07-07 DIAGNOSIS — Z794 Long term (current) use of insulin: Secondary | ICD-10-CM | POA: Diagnosis not present

## 2019-07-07 DIAGNOSIS — G4733 Obstructive sleep apnea (adult) (pediatric): Secondary | ICD-10-CM | POA: Diagnosis present

## 2019-07-07 DIAGNOSIS — Z79891 Long term (current) use of opiate analgesic: Secondary | ICD-10-CM | POA: Diagnosis not present

## 2019-07-07 DIAGNOSIS — Z955 Presence of coronary angioplasty implant and graft: Secondary | ICD-10-CM | POA: Diagnosis not present

## 2019-07-07 DIAGNOSIS — G4730 Sleep apnea, unspecified: Secondary | ICD-10-CM | POA: Diagnosis present

## 2019-07-07 DIAGNOSIS — E785 Hyperlipidemia, unspecified: Secondary | ICD-10-CM | POA: Diagnosis present

## 2019-07-07 DIAGNOSIS — F17290 Nicotine dependence, other tobacco product, uncomplicated: Secondary | ICD-10-CM | POA: Diagnosis present

## 2019-07-07 DIAGNOSIS — I252 Old myocardial infarction: Secondary | ICD-10-CM | POA: Diagnosis not present

## 2019-07-07 HISTORY — PX: LEFT HEART CATH AND CORONARY ANGIOGRAPHY: CATH118249

## 2019-07-07 LAB — GLUCOSE, CAPILLARY
Glucose-Capillary: 213 mg/dL — ABNORMAL HIGH (ref 70–99)
Glucose-Capillary: 177 mg/dL — ABNORMAL HIGH (ref 70–99)
Glucose-Capillary: 116 mg/dL — ABNORMAL HIGH (ref 70–99)
Glucose-Capillary: 110 mg/dL — ABNORMAL HIGH (ref 70–99)
Glucose-Capillary: 98 mg/dL (ref 70–99)
Glucose-Capillary: 183 mg/dL — ABNORMAL HIGH (ref 70–99)

## 2019-07-07 LAB — CBC
HCT: 55.2 % — ABNORMAL HIGH (ref 39.0–52.0)
Hemoglobin: 18.5 g/dL — ABNORMAL HIGH (ref 13.0–17.0)
MCH: 29 pg (ref 26.0–34.0)
MCHC: 33.5 g/dL (ref 30.0–36.0)
MCV: 86.4 fL (ref 80.0–100.0)
Platelets: 205 10*3/uL (ref 150–400)
RBC: 6.39 MIL/uL — ABNORMAL HIGH (ref 4.22–5.81)
RDW: 13.1 % (ref 11.5–15.5)
WBC: 10.8 10*3/uL — ABNORMAL HIGH (ref 4.0–10.5)
nRBC: 0 % (ref 0.0–0.2)

## 2019-07-07 LAB — PROTIME-INR
INR: 1 (ref 0.8–1.2)
Prothrombin Time: 13 seconds (ref 11.4–15.2)

## 2019-07-07 LAB — HIV ANTIBODY (ROUTINE TESTING W REFLEX): HIV Screen 4th Generation wRfx: NONREACTIVE

## 2019-07-07 LAB — HEMOGLOBIN A1C
Hgb A1c MFr Bld: 8.1 % — ABNORMAL HIGH (ref 4.8–5.6)
Mean Plasma Glucose: 185.77 mg/dL

## 2019-07-07 LAB — APTT: aPTT: 30 seconds (ref 24–36)

## 2019-07-07 LAB — SARS CORONAVIRUS 2 BY RT PCR (HOSPITAL ORDER, PERFORMED IN ~~LOC~~ HOSPITAL LAB): SARS Coronavirus 2: NEGATIVE

## 2019-07-07 LAB — BASIC METABOLIC PANEL
Anion gap: 6 (ref 5–15)
BUN: 21 mg/dL — ABNORMAL HIGH (ref 6–20)
CO2: 23 mmol/L (ref 22–32)
Calcium: 8.9 mg/dL (ref 8.9–10.3)
Chloride: 108 mmol/L (ref 98–111)
Creatinine, Ser: 0.97 mg/dL (ref 0.61–1.24)
GFR calc Af Amer: >60 mL/min (ref >60)
GFR calc non Af Amer: >60 mL/min (ref >60)
Glucose, Bld: 217 mg/dL — ABNORMAL HIGH (ref 70–99)
Potassium: 4.3 mmol/L (ref 3.5–5.1)
Sodium: 137 mmol/L (ref 135–145)

## 2019-07-07 LAB — TROPONIN I (HIGH SENSITIVITY)
Troponin I (High Sensitivity): 18 ng/L — ABNORMAL HIGH (ref <18)
Troponin I (High Sensitivity): 50 ng/L — ABNORMAL HIGH (ref <18)
Troponin I (High Sensitivity): 60 ng/L — ABNORMAL HIGH (ref <18)

## 2019-07-07 LAB — ECHOCARDIOGRAM COMPLETE

## 2019-07-07 SURGERY — LEFT HEART CATH AND CORONARY ANGIOGRAPHY
Anesthesia: Moderate Sedation

## 2019-07-07 MED ORDER — SODIUM CHLORIDE 0.9% FLUSH: 3.0000 mL | INTRAVENOUS | Status: DC | PRN

## 2019-07-07 MED ORDER — SODIUM CHLORIDE 0.9% FLUSH
3.0000 mL | Freq: Two times a day (BID) | INTRAVENOUS | Status: DC
  Administered 2019-07-07 – 2019-07-09 (×5): 3 mL via INTRAVENOUS

## 2019-07-07 MED ORDER — MIDAZOLAM HCL 2 MG/2ML IJ SOLN
INTRAMUSCULAR | Status: DC | PRN
  Administered 2019-07-07 (×2): 1 mg via INTRAVENOUS

## 2019-07-07 MED ORDER — ACETAMINOPHEN 325 MG PO TABS: 650.0000 mg | ORAL_TABLET | ORAL | Status: DC | PRN

## 2019-07-07 MED ORDER — PNEUMOCOCCAL VAC POLYVALENT 25 MCG/0.5ML IJ INJ: 0.5000 mL | INJECTION | INTRAMUSCULAR | Status: DC

## 2019-07-07 MED ORDER — HYDRALAZINE HCL 20 MG/ML IJ SOLN: 10.0000 mg | INTRAMUSCULAR | Status: AC | PRN

## 2019-07-07 MED ORDER — SODIUM CHLORIDE 0.9 % WEIGHT BASED INFUSION
3.0000 mL/kg/h | INTRAVENOUS | Status: DC
  Administered 2019-07-07: 13:00:00 3 mL/kg/h via INTRAVENOUS

## 2019-07-07 MED ORDER — LABETALOL HCL 5 MG/ML IV SOLN: 10.0000 mg | INTRAVENOUS | Status: AC | PRN

## 2019-07-07 MED ORDER — SODIUM CHLORIDE 0.9 % IV SOLN: 250.0000 mL | INTRAVENOUS | Status: DC | PRN

## 2019-07-07 MED ORDER — INSULIN ASPART 100 UNIT/ML ~~LOC~~ SOLN
0.0000 [IU] | Freq: Three times a day (TID) | SUBCUTANEOUS | Status: DC
  Administered 2019-07-07: 13:00:00 2 [IU] via SUBCUTANEOUS
  Administered 2019-07-07 – 2019-07-08 (×2): 3 [IU] via SUBCUTANEOUS
  Administered 2019-07-08: 12:00:00 2 [IU] via SUBCUTANEOUS
  Administered 2019-07-08: 1 [IU] via SUBCUTANEOUS
  Administered 2019-07-09: 08:00:00 2 [IU] via SUBCUTANEOUS
  Filled 2019-07-07 (×6): qty 1

## 2019-07-07 MED ORDER — NITROGLYCERIN 0.4 MG SL SUBL
0.4000 mg | SUBLINGUAL_TABLET | SUBLINGUAL | Status: DC | PRN
  Administered 2019-07-07 – 2019-07-08 (×2): 0.4 mg via SUBLINGUAL
  Filled 2019-07-07: qty 1

## 2019-07-07 MED ORDER — ENOXAPARIN SODIUM 100 MG/ML ~~LOC~~ SOLN: 90.0000 mg | Freq: Two times a day (BID) | SUBCUTANEOUS | Status: DC

## 2019-07-07 MED ORDER — ASPIRIN 81 MG PO CHEW
81.0000 mg | CHEWABLE_TABLET | ORAL | Status: AC
  Administered 2019-07-07: 15:00:00 81 mg via ORAL

## 2019-07-07 MED ORDER — SODIUM CHLORIDE 0.9% FLUSH
3.0000 mL | Freq: Two times a day (BID) | INTRAVENOUS | Status: DC
  Administered 2019-07-08 – 2019-07-09 (×4): 3 mL via INTRAVENOUS

## 2019-07-07 MED ORDER — ONDANSETRON HCL 4 MG/2ML IJ SOLN
4.0000 mg | Freq: Four times a day (QID) | INTRAMUSCULAR | Status: DC | PRN
  Administered 2019-07-08: 4 mg via INTRAVENOUS
  Filled 2019-07-07: qty 2

## 2019-07-07 MED ORDER — MORPHINE SULFATE (PF) 2 MG/ML IV SOLN
2.0000 mg | INTRAVENOUS | Status: DC | PRN
  Administered 2019-07-07 – 2019-07-08 (×3): 2 mg via INTRAVENOUS
  Filled 2019-07-07 (×3): qty 1

## 2019-07-07 MED ORDER — SODIUM CHLORIDE 0.9 % WEIGHT BASED INFUSION
1.0000 mL/kg/h | INTRAVENOUS | Status: DC
  Administered 2019-07-07: 15:00:00 1 mL/kg/h via INTRAVENOUS

## 2019-07-07 MED ORDER — ASPIRIN 81 MG PO CHEW
CHEWABLE_TABLET | ORAL | Status: AC
  Filled 2019-07-07: qty 1

## 2019-07-07 MED ORDER — SODIUM CHLORIDE 0.9 % WEIGHT BASED INFUSION
1.0000 mL/kg/h | INTRAVENOUS | Status: AC
  Administered 2019-07-07 (×2): 1 mL/kg/h via INTRAVENOUS

## 2019-07-07 MED ORDER — HEPARIN (PORCINE) IN NACL 2000-0.9 UNIT/L-% IV SOLN
INTRAVENOUS | Status: DC | PRN
  Administered 2019-07-07: 500 mL

## 2019-07-07 MED ORDER — MIDAZOLAM HCL 2 MG/2ML IJ SOLN
INTRAMUSCULAR | Status: AC
  Filled 2019-07-07: qty 2

## 2019-07-07 MED ORDER — METOPROLOL SUCCINATE ER 50 MG PO TB24: 25.0000 mg | ORAL_TABLET | Freq: Every day | ORAL | Status: DC

## 2019-07-07 MED ORDER — ONDANSETRON HCL 4 MG/2ML IJ SOLN
4.0000 mg | Freq: Once | INTRAMUSCULAR | Status: AC
  Administered 2019-07-07: 4 mg via INTRAVENOUS
  Filled 2019-07-07: qty 2

## 2019-07-07 MED ORDER — ASPIRIN 81 MG PO CHEW
324.0000 mg | CHEWABLE_TABLET | Freq: Once | ORAL | Status: AC
  Administered 2019-07-07: 324 mg via ORAL
  Filled 2019-07-07: qty 4

## 2019-07-07 MED ORDER — FENTANYL CITRATE (PF) 100 MCG/2ML IJ SOLN
INTRAMUSCULAR | Status: DC | PRN
  Administered 2019-07-07 (×2): 50 ug via INTRAVENOUS

## 2019-07-07 MED ORDER — FENTANYL CITRATE (PF) 100 MCG/2ML IJ SOLN
INTRAMUSCULAR | Status: AC
  Filled 2019-07-07: qty 2

## 2019-07-07 MED ORDER — IOHEXOL 300 MG/ML  SOLN
INTRAMUSCULAR | Status: DC | PRN
  Administered 2019-07-07: 100 mL via INTRA_ARTERIAL

## 2019-07-07 MED ORDER — FLUTICASONE PROPIONATE 50 MCG/ACT NA SUSP
1.0000 | Freq: Every day | NASAL | Status: DC
  Administered 2019-07-07 – 2019-07-09 (×3): 1 via NASAL
  Filled 2019-07-07: qty 16

## 2019-07-07 MED ORDER — INSULIN ASPART 100 UNIT/ML ~~LOC~~ SOLN: 0.0000 [IU] | Freq: Every day | SUBCUTANEOUS | Status: DC

## 2019-07-07 MED ORDER — INSULIN GLARGINE 100 UNIT/ML ~~LOC~~ SOLN
10.0000 [IU] | Freq: Two times a day (BID) | SUBCUTANEOUS | Status: DC
  Administered 2019-07-07 – 2019-07-09 (×4): 10 [IU] via SUBCUTANEOUS
  Filled 2019-07-07 (×6): qty 0.1

## 2019-07-07 MED ORDER — ASPIRIN EC 81 MG PO TBEC
81.0000 mg | DELAYED_RELEASE_TABLET | Freq: Every day | ORAL | Status: DC
  Administered 2019-07-08: 09:00:00 81 mg via ORAL

## 2019-07-07 MED ORDER — METOPROLOL TARTRATE 25 MG PO TABS
12.5000 mg | ORAL_TABLET | Freq: Two times a day (BID) | ORAL | Status: DC
  Administered 2019-07-07 – 2019-07-08 (×4): 12.5 mg via ORAL
  Filled 2019-07-07 (×4): qty 1

## 2019-07-07 MED ORDER — SODIUM CHLORIDE 0.9 % IV SOLN
INTRAVENOUS | Status: DC
  Administered 2019-07-09: 12:00:00 via INTRAVENOUS

## 2019-07-07 MED ORDER — ONDANSETRON HCL 4 MG/2ML IJ SOLN: 4.0000 mg | Freq: Four times a day (QID) | INTRAMUSCULAR | Status: DC | PRN

## 2019-07-07 SURGICAL SUPPLY — 9 items
CATH INFINITI 5FR ANG PIGTAIL (CATHETERS) ×3 IMPLANT
CATH INFINITI 5FR JL4 (CATHETERS) ×3 IMPLANT
CATH INFINITI JR4 5F (CATHETERS) ×3 IMPLANT
DEVICE CLOSURE MYNXGRIP 5F (Vascular Products) ×3 IMPLANT
KIT MANI 3VAL PERCEP (MISCELLANEOUS) ×3 IMPLANT
NEEDLE PERC 18GX7CM (NEEDLE) ×3 IMPLANT
PACK CARDIAC CATH (CUSTOM PROCEDURE TRAY) ×3 IMPLANT
SHEATH AVANTI 5FR X 11CM (SHEATH) ×3 IMPLANT
WIRE GUIDERIGHT .035X150 (WIRE) ×3 IMPLANT

## 2019-07-07 NOTE — ED Notes (Signed)
.. ED TO INPATIENT HANDOFF REPORT  ED Nurse Name and Phone #: Deneise Lever [phone]  S Name/Age/Gender Chris Norris 58 y.o. male Room/Bed: ED02A/ED02A  Code Status   Code Status: Prior  Home/SNF/Other Home Patient oriented to: self, place, time and situation Is this baseline? Yes   Triage Complete: Triage complete  Chief Complaint chest pain  Triage Note Pt arrives POV to triage with c/o mid chest pain for about an hour. Pt has had x3 previous MI's and stents.    Allergies No Known Allergies  Level of Care/Admitting Diagnosis ED Disposition    ED Disposition Condition Comment   Admit  The patient appears reasonably stabilized for admission considering the current resources, flow, and capabilities available in the ED at this time, and I doubt any other [facility] requiring further screening and/or treatment in the ED prior to admission is  present.       B Medical/Surgery History Past Medical History:  Diagnosis Date  . Coronary artery disease   . Diabetes mellitus without complication ([place])   . Heart disease   . Myocardial infarction (HCC)    X 3 STENTS LAST 10 YEARS AGO  . Sleep apnea in adult    Past Surgical History:  Procedure Laterality Date  . BICEPS TENDON REPAIR    . BICEPS TENDON REPAIR Right 11/28/2018   Procedure: OPEN SUBPECTORAL REPAIR OF THE LONG HEAD OF BICEP TENDON-RIGHT SHOULDER;  Surgeon: Corky Mull, MD;  Location: [facility] ORS;  Service: Orthopedics;  Laterality: Right;  . CORONARY ANGIOPLASTY     X 3  . KNEE ARTHROSCOPY W/ MENISCAL REPAIR    . STENT PLACEMENT VASCULAR ([facility] HX)     X 3 LAST 10 YEARS AGO  . TRICEPS TENDON REPAIR       A IV Location/Drains/Wounds Patient Lines/Drains/Airways Status   Active Line/Drains/Airways    Name:   Placement date:   Placement time:   Site:   Days:   Peripheral IV 03/09/17 Right Forearm   03/09/17    1540    Forearm   850   Peripheral IV 01/11/18 Left Arm   01/11/18    1200    Arm   542   Peripheral IV 07/07/19  Right Forearm   07/07/19    0003    Forearm   less than 1   Incision (Closed) 11/28/18 Arm Right   11/28/18    1347     221          Intake/Output Last 24 hours No intake or output data in the 24 hours ending 07/07/19 0238  Labs/Imaging Results for orders placed or performed during the hospital encounter of 07/07/19 (from the past 48 hour(s))  Basic metabolic panel     Status: Abnormal   Collection Time: 07/07/19 12:03 AM  Result Value Ref Range   Sodium 137 135 - 145 mmol/L   Potassium 4.3 3.5 - 5.1 mmol/L   Chloride 108 98 - 111 mmol/L   CO2 23 22 - 32 mmol/L   Glucose, Bld 217 (H) 70 - 99 mg/dL   BUN 21 (H) 6 - 20 mg/dL   Creatinine, Ser 0.97 0.61 - 1.24 mg/dL   Calcium 8.9 8.9 - 10.3 mg/dL   GFR calc non Af Amer >60 >60 mL/min   GFR calc Af Amer >60 >60 mL/min   Anion gap 6 5 - 15    Comment: Performed at [facility], [address], [place], [place] [address]  CBC  Status: Abnormal   Collection Time: 07/07/19 12:03 AM  Result Value Ref Range   WBC 10.8 (H) 4.0 - 10.5 K/uL   RBC 6.39 (H) 4.22 - 5.81 MIL/uL   Hemoglobin 18.5 (H) 13.0 - 17.0 g/dL   HCT 55.2 (H) 39.0 - 52.0 %   MCV 86.4 80.0 - 100.0 fL   MCH 29.0 26.0 - 34.0 pg   MCHC 33.5 30.0 - 36.0 g/dL   RDW 13.1 11.5 - 15.5 %   Platelets 205 150 - 400 K/uL   nRBC 0.0 0.0 - 0.2 %    Comment: Performed at [facility], [place], [place] [address]  Troponin I (High Sensitivity)     Status: Abnormal   Collection Time: 07/07/19 12:03 AM  Result Value Ref Range   Troponin I (High Sensitivity) 18 (H) <18 ng/L    Comment: (NOTE) Elevated high sensitivity troponin I (hsTnI) values and significant  changes across serial measurements may suggest ACS but many other  chronic and acute conditions are known to elevate hsTnI results.  Refer to the "Links" section for chest pain algorithms and additional  guidance. Performed at [facility], [address] [place], [place]  [address]   Troponin I (High Sensitivity)     Status: Abnormal   Collection Time: 07/07/19  1:55 AM  Result Value Ref Range   Troponin I (High Sensitivity) 50 (H) <18 ng/L    Comment: READ BACK AND VERIFIED WITH Jet Traynham ON 07/07/19 AT 0234 [facility] (NOTE) Elevated high sensitivity troponin I (hsTnI) values and significant  changes across serial measurements may suggest ACS but many other  chronic and acute conditions are known to elevate hsTnI results.  Refer to the "Links" section for chest pain algorithms and additional  guidance. Performed at [facility], [address] [place], [place] [address]    Dg Chest 1 View  Result Date: 07/07/2019 CLINICAL DATA:  Mid chest pain for 1 hour, history of prior MI EXAM: CHEST  1 VIEW COMPARISON:  Radiograph 01/18/2019, CT 01/03/2013 FINDINGS: Chronic interstitial changes similar to priors. No consolidation, features of edema, pneumothorax, or effusion. Pulmonary vascularity is normally distributed. The cardiomediastinal contours are unremarkable. No acute osseous or soft tissue abnormality. IMPRESSION: Chronic interstitial changes.  No acute cardiopulmonary abnormality. Electronically Signed   By: Lovena Le M.D.   On: 07/07/2019 00:34    Pending Labs Unresulted Labs (From admission, onward)   None      Vitals/Pain Today's Vitals   07/07/19 0115 07/07/19 0130 07/07/19 0200 07/07/19 0230  BP:  120/71 112/67 103/62  Pulse: 71 69 70 69  Resp: (!) 22 (!) 26 (!) 22 (!) 25  Temp:      TempSrc:      SpO2: 94% 93% 90% 94%  Weight:      Height:      PainSc:        Isolation Precautions No active isolations  Medications Medications  nitroGLYCERIN (NITROSTAT) SL tablet 0.4 mg (0.4 mg Sublingual Given 07/07/19 0023)  aspirin chewable tablet 324 mg (324 mg Oral Given 07/07/19 0028)  ondansetron (ZOFRAN) injection 4 mg (4 mg Intravenous Given 07/07/19 0028)    Mobility walks Low fall risk   Focused Assessments Cardiac  Assessment Handoff:  Cardiac Rhythm: Normal sinus rhythm Lab Results  Component Value Date   CKTOTAL 100 08/19/2013   CKMB 9.9 (H) 08/19/2013   TROPONINI <0.03 01/18/2019   No results found for: DDIMER Does the Patient  currently have chest pain? No     R Recommendations: See Admitting Provider Note  Report given to:   Additional Notes:

## 2019-07-07 NOTE — ED Notes (Signed)
Pt states that he took 1 baby aspirin at home this evening.

## 2019-07-07 NOTE — H&P (View-Only) (Signed)
Reason for Consult: Unstable angina known coronary disease Referring Physician: Dr. Stark Klein nurse practitioner Cardiologist Dr. Jerrye Bushy is an 58 y.o. male.  HPI: Patient is a 58 year old white male presents with known coronary disease diabetes hypertension hyperlipidemia complains of midsternal chest discomfort which is very severe worse than when he had his heart attack when he needed his stents.  Patient had PCI and stent about 10 years ago has done reasonably well works out regularly with just minimal symptoms had no nitroglycerin at home but when the pain became severe he finally came in for further evaluation  Past Medical History:  Diagnosis Date  . Coronary artery disease   . Diabetes mellitus without complication ([place])   . Heart disease   . Myocardial infarction (HCC)    X 3 STENTS LAST 10 YEARS AGO  . Sleep apnea in adult     Past Surgical History:  Procedure Laterality Date  . BICEPS TENDON REPAIR    . BICEPS TENDON REPAIR Right 11/28/2018   Procedure: OPEN SUBPECTORAL REPAIR OF THE LONG HEAD OF BICEP TENDON-RIGHT SHOULDER;  Surgeon: Corky Mull, MD;  Location: [facility] ORS;  Service: Orthopedics;  Laterality: Right;  . CORONARY ANGIOPLASTY     X 3  . KNEE ARTHROSCOPY W/ MENISCAL REPAIR    . STENT PLACEMENT VASCULAR ([facility] HX)     X 3 LAST 10 YEARS AGO  . TRICEPS TENDON REPAIR      History reviewed. No pertinent family history.  Social History:  reports that he has never smoked. He has never used smokeless tobacco. He reports previous alcohol use. He reports that he does not use drugs.  Allergies: No Known Allergies  Medications: I have reviewed the patient's current medications.  Results for orders placed or performed during the hospital encounter of 07/07/19 (from the past 48 hour(s))  Basic metabolic panel     Status: Abnormal   Collection Time: 07/07/19 12:03 AM  Result Value Ref Range   Sodium 137 135 - 145 mmol/L   Potassium 4.3 3.5 - 5.1 mmol/L    Chloride 108 98 - 111 mmol/L   CO2 23 22 - 32 mmol/L   Glucose, Bld 217 (H) 70 - 99 mg/dL   BUN 21 (H) 6 - 20 mg/dL   Creatinine, Ser 0.97 0.61 - 1.24 mg/dL   Calcium 8.9 8.9 - 10.3 mg/dL   GFR calc non Af Amer >60 >60 mL/min   GFR calc Af Amer >60 >60 mL/min   Anion gap 6 5 - 15    Comment: Performed at [facility], [address] [place], [place] [address]  CBC     Status: Abnormal   Collection Time: 07/07/19 12:03 AM  Result Value Ref Range   WBC 10.8 (H) 4.0 - 10.5 K/uL   RBC 6.39 (H) 4.22 - 5.81 MIL/uL   Hemoglobin 18.5 (H) 13.0 - 17.0 g/dL   HCT 55.2 (H) 39.0 - 52.0 %   MCV 86.4 80.0 - 100.0 fL   MCH 29.0 26.0 - 34.0 pg   MCHC 33.5 30.0 - 36.0 g/dL   RDW 13.1 11.5 - 15.5 %   Platelets 205 150 - 400 K/uL   nRBC 0.0 0.0 - 0.2 %    Comment: Performed at [facility], [place], [place] [address]  Troponin I (High Sensitivity)     Status: Abnormal   Collection Time: 07/07/19 12:03 AM  Result Value Ref Range   Troponin I (High  Sensitivity) 18 (H) <18 ng/L    Comment: (NOTE) Elevated high sensitivity troponin I (hsTnI) values and significant  changes across serial measurements may suggest ACS but many other  chronic and acute conditions are known to elevate hsTnI results.  Refer to the "Links" section for chest pain algorithms and additional  guidance. Performed at [facility], [address] [place], [place] [address]   Troponin I (High Sensitivity)     Status: Abnormal   Collection Time: 07/07/19  1:55 AM  Result Value Ref Range   Troponin I (High Sensitivity) 50 (H) <18 ng/L    Comment: READ BACK AND VERIFIED WITH ANNIE SMITH ON 07/07/19 AT 0234 [facility] (NOTE) Elevated high sensitivity troponin I (hsTnI) values and significant  changes across serial measurements may suggest ACS but many other  chronic and acute conditions are known to elevate hsTnI results.  Refer to the "Links" section for chest pain algorithms and  additional  guidance. Performed at [facility], [address] [place], [place] [address]   SARS Coronavirus 2 by RT PCR (hospital order, performed in [facility] hospital lab) Nasopharyngeal Nasopharyngeal Swab     Status: None   Collection Time: 07/07/19  2:50 AM   Specimen: Nasopharyngeal Swab  Result Value Ref Range   SARS Coronavirus 2 NEGATIVE NEGATIVE    Comment: (NOTE) If result is NEGATIVE SARS-CoV-2 target nucleic acids are NOT DETECTED. The SARS-CoV-2 RNA is generally detectable in upper and lower  respiratory specimens during the acute phase of infection. The lowest  concentration of SARS-CoV-2 viral copies this assay can detect is 250  copies / mL. A negative result does not preclude SARS-CoV-2 infection  and should not be used as the sole basis for treatment or other  patient management decisions.  A negative result may occur with  improper specimen collection / handling, submission of specimen other  than nasopharyngeal swab, presence of viral mutation(s) within the  areas targeted by this assay, and inadequate number of viral copies  (<250 copies / mL). A negative result must be combined with clinical  observations, patient history, and epidemiological information. If result is POSITIVE SARS-CoV-2 target nucleic acids are DETECTED. The SARS-CoV-2 RNA is generally detectable in upper and lower  respiratory specimens dur ing the acute phase of infection.  Positive  results are indicative of active infection with SARS-CoV-2.  Clinical  correlation with patient history and other diagnostic information is  necessary to determine patient infection status.  Positive results do  not rule out bacterial infection or co-infection with other viruses. If result is PRESUMPTIVE POSTIVE SARS-CoV-2 nucleic acids MAY BE PRESENT.   A presumptive positive result was obtained on the submitted specimen  and confirmed on repeat testing.  While 2019 novel coronavirus   (SARS-CoV-2) nucleic acids may be present in the submitted sample  additional confirmatory testing may be necessary for epidemiological  and / or clinical management purposes  to differentiate between  SARS-CoV-2 and other Sarbecovirus currently known to infect humans.  If clinically indicated additional testing with an alternate test  methodology [id number]) is advised. The SARS-CoV-2 RNA is generally  detectable in upper and lower respiratory sp ecimens during the acute  phase of infection. The expected result is Negative. Fact Sheet for Patients:  StrictlyIdeas.no Fact Sheet for Healthcare Providers: BankingDealers.co.za This test is not yet approved or cleared by the [place] FDA and has been authorized for detection and/or diagnosis of SARS-CoV-2 by FDA under an Emergency Use Authorization (EUA).  This EUA will remain in effect (meaning this test can be used) for the duration of the COVID-19 declaration under Section 564(b)(1) of the Act, 21 U.S.C. section 360bbb-3(b)(1), unless the authorization is terminated or revoked sooner. Performed at [facility], [address] [place], [place] [address]   HIV Antibody (routine testing w rflx)     Status: None   Collection Time: 07/07/19  6:39 AM  Result Value Ref Range   HIV Screen 4th Generation wRfx NON REACTIVE NON REACTIVE    Comment: Performed at [facility] [address], [place], [place] [address]  Hemoglobin A1c     Status: Abnormal   Collection Time: 07/07/19  6:39 AM  Result Value Ref Range   Hgb A1c MFr Bld 8.1 (H) 4.8 - 5.6 %    Comment: (NOTE) Pre diabetes:          5.7%-6.4% Diabetes:              >6.4% Glycemic control for   <7.0% adults with diabetes    Mean Plasma Glucose 185.77 mg/dL    Comment: Performed at [facility] [address], [place], [place] [address]  APTT     Status: None   Collection Time: 07/07/19  6:39 AM  Result  Value Ref Range   aPTT 30 24 - 36 seconds    Comment: Performed at [facility], [address] [place], [place] [address]  Protime-INR     Status: None   Collection Time: 07/07/19  6:39 AM  Result Value Ref Range   Prothrombin Time 13.0 11.4 - 15.2 seconds   INR 1.0 0.8 - 1.2    Comment: (NOTE) INR goal varies based on device and disease states. Performed at [facility], [place], [place] [address]   Troponin I (High Sensitivity)     Status: Abnormal   Collection Time: 07/07/19  6:41 AM  Result Value Ref Range   Troponin I (High Sensitivity) 60 (H) <18 ng/L    Comment: (NOTE) Elevated high sensitivity troponin I (hsTnI) values and significant  changes across serial measurements may suggest ACS but many other  chronic and acute conditions are known to elevate hsTnI results.  Refer to the "Links" section for chest pain algorithms and additional  guidance. Performed at [facility], [address] [place], [place] [address]   Glucose, capillary     Status: Abnormal   Collection Time: 07/07/19  9:08 AM  Result Value Ref Range   Glucose-Capillary 213 (H) 70 - 99 mg/dL  Glucose, capillary     Status: Abnormal   Collection Time: 07/07/19 11:38 AM  Result Value Ref Range   Glucose-Capillary 177 (H) 70 - 99 mg/dL    Dg Chest 1 View  Result Date: 07/07/2019 CLINICAL DATA:  Mid chest pain for 1 hour, history of prior MI EXAM: CHEST  1 VIEW COMPARISON:  Radiograph 01/18/2019, CT 01/03/2013 FINDINGS: Chronic interstitial changes similar to priors. No consolidation, features of edema, pneumothorax, or effusion. Pulmonary vascularity is normally distributed. The cardiomediastinal contours are unremarkable. No acute osseous or soft tissue abnormality. IMPRESSION: Chronic interstitial changes.  No acute cardiopulmonary abnormality. Electronically Signed   By: Lovena Le M.D.   On: 07/07/2019 00:34    Review of Systems  Constitutional:  Positive for malaise/fatigue.  HENT: Positive for congestion.   Eyes: Negative.   Respiratory: Positive for shortness of breath.   Cardiovascular: Positive for chest pain and palpitations.  Gastrointestinal: Negative.   Genitourinary: Negative.   Musculoskeletal: Negative.   Skin: Negative.   Neurological: Negative.   Endo/Heme/Allergies: Negative.   Psychiatric/Behavioral: Negative.    Blood pressure 135/82, pulse 72, temperature (!) 97 F (36.1 C), temperature source Oral, resp. rate 20, height 5\' 9"  (1.753 m), weight 93 kg, SpO2 97 %. Physical Exam  Nursing note and vitals reviewed. Constitutional: He is oriented to person, place, and time. He appears well-developed and well-nourished.  HENT:  Head: Normocephalic and atraumatic.  Eyes: Pupils are equal, round, and reactive to light. Conjunctivae and EOM are normal.  Neck: Normal range of motion. Neck supple.  Cardiovascular: Normal rate, regular rhythm and normal heart sounds.  Respiratory: Effort normal and breath sounds normal.  GI: Soft. Bowel sounds are normal.  Musculoskeletal: Normal range of motion.  Neurological: He is alert and oriented to person, place, and time. He has normal reflexes.  Skin: Skin is warm and dry.  Psychiatric: He has a normal mood and affect.    Assessment/Plan: [place] CAD SOB HTN DM PCI x 3 OSA Hx MI Hyperlipidemia . Plan  Agree with admit to telemetry Rule out myocardial infarction follow-up EKGs and troponins Echocardiogram for shortness of breath and assessment left ventricular dysfunction Continue diabetes management and control Continue hypertension management Continue Repatha for lipid management Recommend cardiac cath prior to discharge  Dwayne D Callwood 07/07/2019, 12:50 PM

## 2019-07-07 NOTE — Consult Note (Signed)
Reason for Consult: Unstable angina known coronary disease Referring Physician: Dr. Stark Klein nurse practitioner Cardiologist Dr. Jerrye Norris is an 58 y.o. male.  HPI: Patient is a 58 year old white male presents with known coronary disease diabetes hypertension hyperlipidemia complains of midsternal chest discomfort which is very severe worse than when he had his heart attack when he needed his stents.  Patient had PCI and stent about 10 years ago has done reasonably well works out regularly with just minimal symptoms had no nitroglycerin at home but when the pain became severe he finally came in for further evaluation  Past Medical History:  Diagnosis Date  . Coronary artery disease   . Diabetes mellitus without complication ([place])   . Heart disease   . Myocardial infarction (HCC)    X 3 STENTS LAST 10 YEARS AGO  . Sleep apnea in adult     Past Surgical History:  Procedure Laterality Date  . BICEPS TENDON REPAIR    . BICEPS TENDON REPAIR Right 11/28/2018   Procedure: OPEN SUBPECTORAL REPAIR OF THE LONG HEAD OF BICEP TENDON-RIGHT SHOULDER;  Surgeon: Corky Mull, MD;  Location: [facility] ORS;  Service: Orthopedics;  Laterality: Right;  . CORONARY ANGIOPLASTY     X 3  . KNEE ARTHROSCOPY W/ MENISCAL REPAIR    . STENT PLACEMENT VASCULAR ([facility] HX)     X 3 LAST 10 YEARS AGO  . TRICEPS TENDON REPAIR      History reviewed. No pertinent family history.  Social History:  reports that he has never smoked. He has never used smokeless tobacco. He reports previous alcohol use. He reports that he does not use drugs.  Allergies: No Known Allergies  Medications: I have reviewed the patient's current medications.  Results for orders placed or performed during the hospital encounter of 07/07/19 (from the past 48 hour(s))  Basic metabolic panel     Status: Abnormal   Collection Time: 07/07/19 12:03 AM  Result Value Ref Range   Sodium 137 135 - 145 mmol/L   Potassium 4.3 3.5 - 5.1 mmol/L    Chloride 108 98 - 111 mmol/L   CO2 23 22 - 32 mmol/L   Glucose, Bld 217 (H) 70 - 99 mg/dL   BUN 21 (H) 6 - 20 mg/dL   Creatinine, Ser 0.97 0.61 - 1.24 mg/dL   Calcium 8.9 8.9 - 10.3 mg/dL   GFR calc non Af Amer >60 >60 mL/min   GFR calc Af Amer >60 >60 mL/min   Anion gap 6 5 - 15    Comment: Performed at [facility], [address] [place], [place] [address]  CBC     Status: Abnormal   Collection Time: 07/07/19 12:03 AM  Result Value Ref Range   WBC 10.8 (H) 4.0 - 10.5 K/uL   RBC 6.39 (H) 4.22 - 5.81 MIL/uL   Hemoglobin 18.5 (H) 13.0 - 17.0 g/dL   HCT 55.2 (H) 39.0 - 52.0 %   MCV 86.4 80.0 - 100.0 fL   MCH 29.0 26.0 - 34.0 pg   MCHC 33.5 30.0 - 36.0 g/dL   RDW 13.1 11.5 - 15.5 %   Platelets 205 150 - 400 K/uL   nRBC 0.0 0.0 - 0.2 %    Comment: Performed at [facility], [place], [place] [address]  Troponin I (High Sensitivity)     Status: Abnormal   Collection Time: 07/07/19 12:03 AM  Result Value Ref Range   Troponin I (High  Sensitivity) 18 (H) <18 ng/L    Comment: (NOTE) Elevated high sensitivity troponin I (hsTnI) values and significant  changes across serial measurements may suggest ACS but many other  chronic and acute conditions are known to elevate hsTnI results.  Refer to the "Links" section for chest pain algorithms and additional  guidance. Performed at [facility], [address] [place], [place] [address]   Troponin I (High Sensitivity)     Status: Abnormal   Collection Time: 07/07/19  1:55 AM  Result Value Ref Range   Troponin I (High Sensitivity) 50 (H) <18 ng/L    Comment: READ BACK AND VERIFIED WITH ANNIE SMITH ON 07/07/19 AT 0234 [facility] (NOTE) Elevated high sensitivity troponin I (hsTnI) values and significant  changes across serial measurements may suggest ACS but many other  chronic and acute conditions are known to elevate hsTnI results.  Refer to the "Links" section for chest pain algorithms and  additional  guidance. Performed at [facility], [address] [place], [place] [address]   SARS Coronavirus 2 by RT PCR (hospital order, performed in [facility] hospital lab) Nasopharyngeal Nasopharyngeal Swab     Status: None   Collection Time: 07/07/19  2:50 AM   Specimen: Nasopharyngeal Swab  Result Value Ref Range   SARS Coronavirus 2 NEGATIVE NEGATIVE    Comment: (NOTE) If result is NEGATIVE SARS-CoV-2 target nucleic acids are NOT DETECTED. The SARS-CoV-2 RNA is generally detectable in upper and lower  respiratory specimens during the acute phase of infection. The lowest  concentration of SARS-CoV-2 viral copies this assay can detect is 250  copies / mL. A negative result does not preclude SARS-CoV-2 infection  and should not be used as the sole basis for treatment or other  patient management decisions.  A negative result may occur with  improper specimen collection / handling, submission of specimen other  than nasopharyngeal swab, presence of viral mutation(s) within the  areas targeted by this assay, and inadequate number of viral copies  (<250 copies / mL). A negative result must be combined with clinical  observations, patient history, and epidemiological information. If result is POSITIVE SARS-CoV-2 target nucleic acids are DETECTED. The SARS-CoV-2 RNA is generally detectable in upper and lower  respiratory specimens dur ing the acute phase of infection.  Positive  results are indicative of active infection with SARS-CoV-2.  Clinical  correlation with patient history and other diagnostic information is  necessary to determine patient infection status.  Positive results do  not rule out bacterial infection or co-infection with other viruses. If result is PRESUMPTIVE POSTIVE SARS-CoV-2 nucleic acids MAY BE PRESENT.   A presumptive positive result was obtained on the submitted specimen  and confirmed on repeat testing.  While 2019 novel coronavirus   (SARS-CoV-2) nucleic acids may be present in the submitted sample  additional confirmatory testing may be necessary for epidemiological  and / or clinical management purposes  to differentiate between  SARS-CoV-2 and other Sarbecovirus currently known to infect humans.  If clinically indicated additional testing with an alternate test  methodology [phone]) is advised. The SARS-CoV-2 RNA is generally  detectable in upper and lower respiratory sp ecimens during the acute  phase of infection. The expected result is Negative. Fact Sheet for Patients:  StrictlyIdeas.no Fact Sheet for Healthcare Providers: BankingDealers.co.za This test is not yet approved or cleared by the [place] FDA and has been authorized for detection and/or diagnosis of SARS-CoV-2 by FDA under an Emergency Use Authorization (EUA).  This EUA will remain in effect (meaning this test can be used) for the duration of the COVID-19 declaration under Section 564(b)(1) of the Act, 21 U.S.C. section 360bbb-3(b)(1), unless the authorization is terminated or revoked sooner. Performed at [facility], [address] [place], [place] [address]   HIV Antibody (routine testing w rflx)     Status: None   Collection Time: 07/07/19  6:39 AM  Result Value Ref Range   HIV Screen 4th Generation wRfx NON REACTIVE NON REACTIVE    Comment: Performed at [facility] [address], [place], [place] [address]  Hemoglobin A1c     Status: Abnormal   Collection Time: 07/07/19  6:39 AM  Result Value Ref Range   Hgb A1c MFr Bld 8.1 (H) 4.8 - 5.6 %    Comment: (NOTE) Pre diabetes:          5.7%-6.4% Diabetes:              >6.4% Glycemic control for   <7.0% adults with diabetes    Mean Plasma Glucose 185.77 mg/dL    Comment: Performed at [facility] [address] [place], [place] [address]  APTT     Status: None   Collection Time: 07/07/19  6:39 AM  Result  Value Ref Range   aPTT 30 24 - 36 seconds    Comment: Performed at [facility], [address] [place], [place] [address]  Protime-INR     Status: None   Collection Time: 07/07/19  6:39 AM  Result Value Ref Range   Prothrombin Time 13.0 11.4 - 15.2 seconds   INR 1.0 0.8 - 1.2    Comment: (NOTE) INR goal varies based on device and disease states. Performed at [facility], [place], [place] [address]   Troponin I (High Sensitivity)     Status: Abnormal   Collection Time: 07/07/19  6:41 AM  Result Value Ref Range   Troponin I (High Sensitivity) 60 (H) <18 ng/L    Comment: (NOTE) Elevated high sensitivity troponin I (hsTnI) values and significant  changes across serial measurements may suggest ACS but many other  chronic and acute conditions are known to elevate hsTnI results.  Refer to the "Links" section for chest pain algorithms and additional  guidance. Performed at [facility], [address] [place],  [address]   Glucose, capillary     Status: Abnormal   Collection Time: 07/07/19  9:08 AM  Result Value Ref Range   Glucose-Capillary 213 (H) 70 - 99 mg/dL  Glucose, capillary     Status: Abnormal   Collection Time: 07/07/19 11:38 AM  Result Value Ref Range   Glucose-Capillary 177 (H) 70 - 99 mg/dL    Dg Chest 1 View  Result Date: 07/07/2019 CLINICAL DATA:  Mid chest pain for 1 hour, history of prior MI EXAM: CHEST  1 VIEW COMPARISON:  Radiograph 01/18/2019, CT 01/03/2013 FINDINGS: Chronic interstitial changes similar to priors. No consolidation, features of edema, pneumothorax, or effusion. Pulmonary vascularity is normally distributed. The cardiomediastinal contours are unremarkable. No acute osseous or soft tissue abnormality. IMPRESSION: Chronic interstitial changes.  No acute cardiopulmonary abnormality. Electronically Signed   By: Lovena Le M.D.   On: 07/07/2019 00:34    Review of Systems  Constitutional:  Positive for malaise/fatigue.  HENT: Positive for congestion.   Eyes: Negative.   Respiratory: Positive for shortness of breath.   Cardiovascular: Positive for chest pain and palpitations.  Gastrointestinal: Negative.   Genitourinary: Negative.   Musculoskeletal: Negative.   Skin: Negative.   Neurological: Negative.   Endo/Heme/Allergies: Negative.   Psychiatric/Behavioral: Negative.    Blood pressure 135/82, pulse 72, temperature (!) 97 F (36.1 C), temperature source Oral, resp. rate 20, height 5\' 9"  (1.753 m), weight 93 kg, SpO2 97 %. Physical Exam  Nursing note and vitals reviewed. Constitutional: He is oriented to person, place, and time. He appears well-developed and well-nourished.  HENT:  Head: Normocephalic and atraumatic.  Eyes: Pupils are equal, round, and reactive to light. Conjunctivae and EOM are normal.  Neck: Normal range of motion. Neck supple.  Cardiovascular: Normal rate, regular rhythm and normal heart sounds.  Respiratory: Effort normal and breath sounds normal.  GI: Soft. Bowel sounds are normal.  Musculoskeletal: Normal range of motion.  Neurological: He is alert and oriented to person, place, and time. He has normal reflexes.  Skin: Skin is warm and dry.  Psychiatric: He has a normal mood and affect.    Assessment/Plan: [place] CAD SOB HTN DM PCI x 3 OSA Hx MI Hyperlipidemia . Plan  Agree with admit to telemetry Rule out myocardial infarction follow-up EKGs and troponins Echocardiogram for shortness of breath and assessment left ventricular dysfunction Continue diabetes management and control Continue hypertension management Continue Repatha for lipid management Recommend cardiac cath prior to discharge  Dejanay Wamboldt D Kareli Hossain 07/07/2019, 12:50 PM

## 2019-07-07 NOTE — Progress Notes (Addendum)
Patient admitted early this morning.  Seen and examined by me later in the morning.  He denies any active chest pain this morning, but states that his chest pain at home felt similar to his previous heart attacks.  On exam, he is sitting up in the chair in no acute distress.  RRR, no murmurs.  Lungs are clear to auscultation bilaterally.  No lower extremity edema.  Chest pain with a history of previous MI s/p stents x3 -Cardiology following and will perform cardiac cath today -Continue aspirin and metoprolol -Cardiac monitoring  Type 2 diabetes -Continue Lantus and SSI  Hyman Bible, MD

## 2019-07-07 NOTE — H&P (Signed)
[facility] NAME: Chris Norris    MR#:  [id number]  DATE OF BIRTH:  1961/03/21  DATE OF ADMISSION:  07/07/2019  PRIMARY CARE PHYSICIAN: Remi Haggard, FNP   REQUESTING/REFERRING PHYSICIAN: Rudene Re MD  CHIEF COMPLAINT:   Chief Complaint  Patient presents with  . Chest Pain    HISTORY OF PRESENT ILLNESS:  58 y.o. male with pertinent past medical history of CAD s/p RCA, LAD stent placement, diabetes mellitus type 2 and anxiety presenting to the ED with chief complaints of chest pain.   Patient report onset of symptoms since 11pm yesterday while preparing to go to bed. Describes symptoms of pain in his left ear radiating to left jaw, neck,  down to his arm then chest area. Patient describes chest pain as squeezing similar to what he had over ten years ago when he had an MI.  Denies associated symptoms of nausea vomiting, diaphoresis, abdominal pain, or shortness of breath.  On arrival to the ED, he was afebrile with blood pressure 138/79 mm Hg and pulse rate 81 beats/min. There were no focal neurological deficits; he was alert and oriented x4, but in mild discomfort.  Initial labs revealed 217 otherwise unremarkable BMP,'s CBC shows slightly elevated white count 10.8, hemoglobin 18.5 hematocrit 55.2, troponin 18, repeat 50.  COVID-19 negative EKG shows chronic interstitial changes otherwise no acute cardiopulmonary abnormality.  PAST MEDICAL HISTORY:   Past Medical History:  Diagnosis Date  . Coronary artery disease   . Diabetes mellitus without complication ([place])   . Heart disease   . Myocardial infarction (HCC)    X 3 STENTS LAST 10 YEARS AGO  . Sleep apnea in adult     PAST SURGICAL HISTORY:   Past Surgical History:  Procedure Laterality Date  . BICEPS TENDON REPAIR    . BICEPS TENDON REPAIR Right 11/28/2018   Procedure: OPEN SUBPECTORAL REPAIR OF THE LONG HEAD OF BICEP TENDON-RIGHT SHOULDER;  Surgeon:  Corky Mull, MD;  Location: [facility] ORS;  Service: Orthopedics;  Laterality: Right;  . CORONARY ANGIOPLASTY     X 3  . KNEE ARTHROSCOPY W/ MENISCAL REPAIR    . STENT PLACEMENT VASCULAR ([facility] HX)     X 3 LAST 10 YEARS AGO  . TRICEPS TENDON REPAIR      SOCIAL HISTORY:   Social History   Tobacco Use  . Smoking status: Never Smoker  . Smokeless tobacco: Never Used  . Tobacco comment: occasional cigars  Substance Use Topics  . Alcohol use: Not Currently    FAMILY HISTORY:  No family history on file.  DRUG ALLERGIES:  No Known Allergies  REVIEW OF SYSTEMS:   Review of Systems  Constitutional: Negative for chills, fever, malaise/fatigue and weight loss.  HENT: Negative for congestion, hearing loss and sore throat.   Eyes: Negative for blurred vision and double vision.  Respiratory: Negative for cough, shortness of breath and wheezing.   Cardiovascular: Positive for chest pain. Negative for palpitations, orthopnea and leg swelling.  Gastrointestinal: Negative for abdominal pain, diarrhea, nausea and vomiting.  Genitourinary: Negative for dysuria and urgency.  Musculoskeletal: Negative for myalgias.  Skin: Negative for rash.  Neurological: Negative for dizziness, sensory change, speech change, focal weakness and headaches.  Psychiatric/Behavioral: Negative for depression.   MEDICATIONS AT HOME:   Prior to Admission medications   Medication Sig Start Date End Date Taking? Authorizing Provider  aspirin EC 81 MG tablet Take 81 mg by  mouth 2 (two) times daily.   Yes [provider]  Diclofenac Sodium CR 100 MG 24 hr tablet Take 100 mg by mouth daily.   Yes [provider]  Insulin Glargine (TOUJEO MAX SOLOSTAR) 300 UNIT/ML SOPN Inject 10 Units into the skin 2 (two) times daily.   Yes [provider]  azithromycin (ZITHROMAX) 250 MG tablet 2 tablets today, then 1 tablet for the next 4 days. Patient not taking: Reported on 06/04/2019 01/18/19   Sherrie George  B, FNP  Evolocumab (REPATHA) 140 MG/ML SOSY Inject 140 mg into the skin every 14 (fourteen) days.    [provider]  Icosapent Ethyl (VASCEPA) 1 g CAPS Take 2 g by mouth every morning.    [provider]  insulin aspart (NOVOLOG) 100 UNIT/ML injection Inject 0-10 Units into the skin See admin instructions. FOR HIGH BLOOD SUGAR  USUALLY 2 X MONTH    [provider]  oxyCODONE (ROXICODONE) 5 MG immediate release tablet Take 1-2 tablets (5-10 mg total) by mouth every 4 (four) hours as needed. Patient not taking: Reported on 07/07/2019 11/28/18   Poggi, Marshall Cork, MD      VITAL SIGNS:  Blood pressure 103/62, pulse 69, temperature 98.6 F (37 C), temperature source Oral, resp. rate (!) 25, height 5\' 9"  (1.753 m), weight 93 kg, SpO2 94 %.  PHYSICAL EXAMINATION:   Physical Exam  GENERAL:  58 y.o.-year-old patient lying in the bed with no acute distress.  EYES: Pupils equal, round, reactive to light and accommodation. No scleral icterus. Extraocular muscles intact.  HEENT: Head atraumatic, normocephalic. Oropharynx and nasopharynx clear.  NECK:  Supple, no jugular venous distention. No thyroid enlargement, no tenderness.  LUNGS: Normal breath sounds bilaterally, no wheezing, rales,rhonchi or crepitation. No use of accessory muscles of respiration.  CARDIOVASCULAR: S1, S2 normal. No murmurs, rubs, or gallops.  ABDOMEN: Soft, nontender, nondistended. Bowel sounds present. No organomegaly or mass.  EXTREMITIES: No pedal edema, cyanosis, or clubbing.  NEUROLOGIC: Cranial nerves II through XII are intact. Muscle strength 5/5 in all extremities. Sensation intact. Gait not checked.  PSYCHIATRIC: The patient is alert and oriented x 3.  SKIN: No obvious rash, lesion, or ulcer.   DATA REVIEWED:  LABORATORY PANEL:   CBC Recent Labs  Lab 07/07/19 0003  WBC 10.8*  HGB 18.5*  HCT 55.2*  PLT 205    ------------------------------------------------------------------------------------------------------------------  Chemistries  Recent Labs  Lab 07/07/19 0003  NA 137  K 4.3  CL 108  CO2 23  GLUCOSE 217*  BUN 21*  CREATININE 0.97  CALCIUM 8.9   ------------------------------------------------------------------------------------------------------------------  Cardiac Enzymes No results for input(s): TROPONINI in the last 168 hours. ------------------------------------------------------------------------------------------------------------------  RADIOLOGY:  Dg Chest 1 View  Result Date: 07/07/2019 CLINICAL DATA:  Mid chest pain for 1 hour, history of prior MI EXAM: CHEST  1 VIEW COMPARISON:  Radiograph 01/18/2019, CT 01/03/2013 FINDINGS: Chronic interstitial changes similar to priors. No consolidation, features of edema, pneumothorax, or effusion. Pulmonary vascularity is normally distributed. The cardiomediastinal contours are unremarkable. No acute osseous or soft tissue abnormality. IMPRESSION: Chronic interstitial changes.  No acute cardiopulmonary abnormality. Electronically Signed   By: Lovena Le M.D.   On: 07/07/2019 00:34    EKG:  EKG: normal EKG, normal sinus rhythm, unchanged from previous tracings. Vent. rate 78 BPM PR interval * ms QRS duration 101 ms QT/QTc 367/418 ms P-R-T axes 70 6 4 IMPRESSION AND PLAN:   58 y.o. male with pertinent past medical history of CAD s/p  RCA, LAD stent placement, diabetes mellitus type 2 and anxiety presenting to the ED with chief complaints of chest pain.   1. NSTEMI / Unstable Angina - No dynamic EKG changes with abnormal cardiac enzymes *Hx of  CAD  With MI s/p RCA, LAD stent placement - Admit to telemetry unit - ASA 81mg  PO daily (initial 162-324mg  = 2-4x 81mg  chewable tablets) - Metoprolol 12.5mg  PO bid (titrate to goal HR<70)  - NTG + morphine PRN chest pain - Trend troponin - Check UA, UDS - Cardiology consult,  Placed to Dr. Saralyn Pilar  2. Diabetes mellitus type 2 - Check hemoglobin A1c - Continue Lantus - SSI  3. Hyperlipidemia - Check lipid panel - Continue Repatha  4. DVT prophylaxis - Heparin SubQ   All the records are reviewed and case discussed with ED provider. Management plans discussed with the patient, family and they are in agreement.  CODE STATUS: FULL  TOTAL TIME TAKING CARE OF THIS PATIENT: 50 minutes.    on 07/07/2019 at 4:20 AM   Rufina Falco, DNP, FNP-BC [facility] Nurse Practitioner Between 7am to 6pm - Pager [phone]  After 6pm go to [email or web address] - password [email or web address] [facility]  Office  [phone]  CC: Primary care physician; Remi Haggard, FNP

## 2019-07-07 NOTE — ED Provider Notes (Signed)
[facility] Emergency Department Provider Note  ____________________________________________  Time seen: Approximately 12:51 AM  I have reviewed the triage vital signs and the nursing notes.   HISTORY  Chief Complaint Chest Pain   HPI Chris Norris is a 58 y.o. male history of CAD status post stents, diabetes, sleep apnea, polycythemia who presents for evaluation of chest pain.  Patient reports the pain started this evening while he was laying in bed.  He describes the pain as severe pressure in the center of his chest radiating up to his jaw and down his left arm associated with shortness of breath and nausea.  Pain was identical to prior heart attacks.  Patient has not had a catheterization in several years.  Denies history of smoking or drug use.  On arrival to the emergency room patient was complaining of 10 out of 10 pain which was exacerbated by walking to the x-ray machine and back to his room.  He denies back pain, numbness or weakness of his extremities.   Past Medical History:  Diagnosis Date  . Coronary artery disease   . Diabetes mellitus without complication ([place])   . Heart disease   . Myocardial infarction (HCC)    X 3 STENTS LAST 10 YEARS AGO  . Sleep apnea in adult     Patient Active Problem List   Diagnosis Date Noted  . Chest pain with high risk of acute coronary syndrome 07/07/2019  . Secondary polycythemia 12/28/2017  . 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 Anxiety 08/11/2014  . Coronary artery disease 08/11/2014    Past Surgical History:  Procedure Laterality Date  . BICEPS TENDON REPAIR    . BICEPS TENDON REPAIR Right 11/28/2018   Procedure: OPEN SUBPECTORAL REPAIR OF THE LONG HEAD OF BICEP TENDON-RIGHT SHOULDER;  Surgeon: Corky Mull, MD;  Location: [facility] ORS;  Service: Orthopedics;  Laterality: Right;  . CORONARY ANGIOPLASTY     X 3  . KNEE ARTHROSCOPY W/ MENISCAL REPAIR    . STENT PLACEMENT VASCULAR ([facility] HX)     X 3  LAST 10 YEARS AGO  . TRICEPS TENDON REPAIR      Prior to Admission medications   Medication Sig Start Date End Date Taking? Authorizing Provider  aspirin EC 81 MG tablet Take 81 mg by mouth 2 (two) times daily.   Yes [provider]  Diclofenac Sodium CR 100 MG 24 hr tablet Take 100 mg by mouth daily.   Yes [provider]  Evolocumab (REPATHA) 140 MG/ML SOSY Inject 140 mg into the skin every 14 (fourteen) days.   Yes [provider]  Icosapent Ethyl (VASCEPA) 1 g CAPS Take 2 g by mouth every morning.   Yes [provider]  insulin aspart (NOVOLOG) 100 UNIT/ML injection Inject 0-10 Units into the skin as needed. FOR HIGH BLOOD SUGAR  USUALLY 2 X MONTH   Yes [provider]  Insulin Glargine (TOUJEO MAX SOLOSTAR) 300 UNIT/ML SOPN Inject 10 Units into the skin 2 (two) times daily.   Yes [provider]  azithromycin (ZITHROMAX) 250 MG tablet 2 tablets today, then 1 tablet for the next 4 days. Patient not taking: Reported on 06/04/2019 01/18/19   Sherrie George B, FNP  oxyCODONE (ROXICODONE) 5 MG immediate release tablet Take 1-2 tablets (5-10 mg total) by mouth every 4 (four) hours as needed. Patient not taking: Reported on 07/07/2019 11/28/18   Poggi, Marshall Cork, MD    Allergies Patient has no known allergies.  No family history on file.  Social History Social History   Tobacco Use  . Smoking status: Never Smoker  . Smokeless tobacco: Never Used  . Tobacco comment: occasional cigars  Substance Use Topics  . Alcohol use: Not Currently  . Drug use: Never    Review of Systems  Constitutional: Negative for fever. Eyes: Negative for visual changes. ENT: Negative for sore throat. Neck: No neck pain  Cardiovascular: + chest pain. Respiratory: + shortness of breath. Gastrointestinal: Negative for abdominal pain, vomiting or diarrhea. + nausea Genitourinary: Negative for dysuria. Musculoskeletal: Negative for back pain. Skin: Negative  for rash. Neurological: Negative for headaches, weakness or numbness. Psych: No SI or HI  ____________________________________________   PHYSICAL EXAM:  VITAL SIGNS: ED Triage Vitals  Enc Vitals Group     BP 07/07/19 0005 138/79     Pulse Rate 07/07/19 0005 81     Resp 07/07/19 0005 18     Temp 07/07/19 0005 98.6 F (37 C)     Temp Source 07/07/19 0005 Oral     SpO2 07/07/19 0005 95 %     Weight 07/06/19 2353 205 lb (93 kg)     Height 07/06/19 2353 5\' 9"  (1.753 m)     Head Circumference --      Peak Flow --      Pain Score 07/06/19 2353 5     Pain Loc --      Pain Edu? --      Excl. in [place]? --     Constitutional: Alert and oriented. Well appearing and in no apparent distress. HEENT:      Head: Normocephalic and atraumatic.         Eyes: Conjunctivae are normal. Sclera is non-icteric.       Mouth/Throat: Mucous membranes are moist.       Neck: Supple with no signs of meningismus. Cardiovascular: Regular rate and rhythm. No murmurs, gallops, or rubs. 2+ symmetrical distal pulses are present in all extremities. No JVD. Respiratory: Normal respiratory effort. Lungs are clear to auscultation bilaterally. No wheezes, crackles, or rhonchi.  Gastrointestinal: Soft, non tender, and non distended with positive bowel sounds. No rebound or guarding. Musculoskeletal: Nontender with normal range of motion in all extremities. No edema, cyanosis, or erythema of extremities. Neurologic: Normal speech and language. Face is symmetric. Moving all extremities. No gross focal neurologic deficits are appreciated. Skin: Skin is warm, dry and intact. No rash noted. Psychiatric: Mood and affect are normal. Speech and behavior are normal.  ____________________________________________   LABS (all labs ordered are listed, but only abnormal results are displayed)  Labs Reviewed  BASIC METABOLIC PANEL - Abnormal; Notable for the following components:      Result Value   Glucose, Bld 217 (*)     BUN 21 (*)    All other components within normal limits  CBC - Abnormal; Notable for the following components:   WBC 10.8 (*)    RBC 6.39 (*)    Hemoglobin 18.5 (*)    HCT 55.2 (*)    All other components within normal limits  TROPONIN I (HIGH SENSITIVITY) - Abnormal; Notable for the following components:   Troponin I (High Sensitivity) 18 (*)    All other components within normal limits  TROPONIN I (HIGH SENSITIVITY) - Abnormal; Notable for the following components:   Troponin I (High Sensitivity) 50 (*)    All other components within normal limits  SARS CORONAVIRUS 2 BY RT PCR (HOSPITAL ORDER, PERFORMED IN  [facility])  HIV ANTIBODY (ROUTINE TESTING W REFLEX)  HIV4GL SAVE TUBE  HEMOGLOBIN A1C  APTT  PROTIME-INR   ____________________________________________  EKG  ED ECG REPORT I, Rudene Re, the attending physician, personally viewed and interpreted this ECG.  23:56 -normal sinus rhythm, rate of 83, left axis deviation, no ST elevations or depressions.  Unchanged from prior.  00:22 -normal sinus rhythm, rate of 78, normal intervals, normal axis, no ST elevations or depressions. ____________________________________________  RADIOLOGY  I have personally reviewed the images performed during this visit and I agree with the Radiologist's read.   Interpretation by Radiologist:  Dg Chest 1 View  Result Date: 07/07/2019 CLINICAL DATA:  Mid chest pain for 1 hour, history of prior MI EXAM: CHEST  1 VIEW COMPARISON:  Radiograph 01/18/2019, CT 01/03/2013 FINDINGS: Chronic interstitial changes similar to priors. No consolidation, features of edema, pneumothorax, or effusion. Pulmonary vascularity is normally distributed. The cardiomediastinal contours are unremarkable. No acute osseous or soft tissue abnormality. IMPRESSION: Chronic interstitial changes.  No acute cardiopulmonary abnormality. Electronically Signed   By: Lovena Le M.D.   On: 07/07/2019 00:34      ____________________________________________   PROCEDURES  Procedure(s) performed: None Procedures Critical Care performed: yes  CRITICAL CARE Performed by: Rudene Re  ?  Total critical care time: 30 min  Critical care time was exclusive of separately billable procedures and treating other patients.  Critical care was necessary to treat or prevent imminent or life-threatening deterioration.  Critical care was time spent personally by me on the following activities: development of treatment plan with patient and/or surrogate as well as nursing, discussions with consultants, evaluation of patient's response to treatment, examination of patient, obtaining history from patient or surrogate, ordering and performing treatments and interventions, ordering and review of laboratory studies, ordering and review of radiographic studies, pulse oximetry and re-evaluation of patient's condition.  ____________________________________________   INITIAL IMPRESSION / ASSESSMENT AND PLAN / ED COURSE   58 y.o. male history of CAD status post stents, diabetes, sleep apnea, polycythemia who presents for evaluation of chest pain. History of chest pain concerning for possible cardiac etiology. Exam is benign. EKG x 2 with no acute ischemic changes. Patient received 1 sublingual nitro and a full dose ASA with full resolution of the pain. Initial HStroponin of 18 which is new for patient. Clinical picture and evaluation not suggestive of other serious cause including pneumonia, pulmonary embolism, pneumothorax, esophageal rupture, or aortic dissection.  Recommended admission to the hospitalist service for further evaluation which patient declines at this time.  Patient wishes to get a repeat troponin and if that is trending up he will accept admission otherwise he prefers follow-up with his cardiologist as an outpatient.  I explained to the patient that his presentation was consistent with ACS especially  based on his history and the fact that he has not had a catheterization in several years that even if his troponin is not going up he could still have a blockage that may require intervention to prevent a more fulminant heart attack.  He understands the risks of dysrhythmias and heart attack and death and continues to decline admission at this time.  We will continue to monitor closely.  _________________________ 2:38 AM on 07/07/2019 -----------------------------------------  Repeat troponin 50 consistent with NSTEMI. Patient remains CP free. Will admit to Hospitalist       As part of my medical decision making, I reviewed the following data within the [place] notes  reviewed and incorporated, Labs reviewed , EKG interpreted , Old EKG reviewed, Old chart reviewed, Radiograph reviewed , Discussed with admitting physician , Notes from prior ED visits and [place] Controlled Substance Database   Patient was evaluated in Emergency Department today for the symptoms described in the history of present illness. Patient was evaluated in the context of the global COVID-19 pandemic, which necessitated consideration that the patient might be at risk for infection with the SARS-CoV-2 virus that causes COVID-19. Institutional protocols and algorithms that pertain to the evaluation of patients at risk for COVID-19 are in a state of rapid change based on information released by regulatory bodies including the CDC and federal and state organizations. These policies and algorithms were followed during the patient's care in the ED.   ____________________________________________   FINAL CLINICAL IMPRESSION(S) / ED DIAGNOSES   Final diagnoses:  NSTEMI (non-ST elevated myocardial infarction) ([place])      NEW MEDICATIONS STARTED DURING THIS VISIT:  ED Discharge Orders    None       Note:  This document was prepared using Dragon voice recognition software and may include  unintentional dictation errors.    Rudene Re, MD 07/07/19 [id number]

## 2019-07-07 NOTE — ED Notes (Signed)
Date and time results received: 07/07/19 2:35 AM  (use smartphrase ".now" to insert current time)  Test: Troponin Critical Value: 50  Name of Provider Notified: Alfred Levins  Orders Received? Or Actions Taken?: Orders Received - See Orders for details

## 2019-07-07 NOTE — Progress Notes (Signed)
*  PRELIMINARY RESULTS* Echocardiogram 2D Echocardiogram has been performed.  Chris Norris 07/07/2019, 11:06 AM

## 2019-07-07 NOTE — Progress Notes (Signed)
ANTICOAGULATION CONSULT NOTE - Initial Consult  Pharmacy Consult for enoxaparin Indication: chest pain/ACS  No Known Allergies  Patient Measurements: Height: 5\' 9"  (175.3 cm) Weight: 205 lb (93 kg) IBW/kg (Calculated) : 70.7  Vital Signs: Temp: 98.6 F (37 C) (10/12 0005) Temp Source: Oral (10/12 0005) BP: 107/69 (10/12 0430) Pulse Rate: 65 (10/12 0430)  Labs: Recent Labs    07/07/19 0003 07/07/19 0155  HGB 18.5*  --   HCT 55.2*  --   PLT 205  --   CREATININE 0.97  --   TROPONINIHS 18* 50*    Estimated Creatinine Clearance: 96.9 mL/min (by C-G formula based on SCr of 0.97 mg/dL).   Medical History: Past Medical History:  Diagnosis Date  . Coronary artery disease   . Diabetes mellitus without complication ([place])   . Heart disease   . Myocardial infarction (HCC)    X 3 STENTS LAST 10 YEARS AGO  . Sleep apnea in adult     Medications:  Scheduled:  . [START ON 07/08/2019] aspirin EC  81 mg Oral Daily  . enoxaparin (LOVENOX) injection  90 mg Subcutaneous Q12H  . insulin aspart  0-5 Units Subcutaneous QHS  . insulin aspart  0-9 Units Subcutaneous TID WC  . insulin glargine  10 Units Subcutaneous BID  . metoprolol succinate  25 mg Oral Daily    Assessment: Patient admitted for CP w/ h/o CAD and MI x 3 stents, EKG showing borderline T-wave abnormalities and initial trop of 18 >> 50. 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 Patient is being started on full dose lovenox for NSTEMI  Goal of Therapy:  Monitor platelets by anticoagulation protocol: Yes   Plan:  Will dose patient enoxaparin 1 mg/kg: Enoxaparin 90 mg subcutaneous q12h. Baseline labs pending, baseline H/H elevated compared to his baseline (16/46). Will monitor daily CBC's, no anti-Xa level monitoring required at this time. Will continue to monitor CBC trends and trops w/ clinical course.  Tobie Lords, PharmD,  BCPS Clinical Pharmacist 07/07/2019,5:02 AM

## 2019-07-07 NOTE — ED Notes (Signed)
Patient belligerent to staff and MD on arrival to room. Patient answering questions but condescending and loud. Patient told to lower his voice and be respectful to the MD who is currently assisting him. Patient apologized blaming his behavior on the "pain in his chest." Patient then states "as is sit here the pain is much better and I'm feeling a little better." MD continues her assessment of patient. EKG performed and printed, exported.

## 2019-07-08 ENCOUNTER — Encounter: Payer: Self-pay | Admitting: Internal Medicine

## 2019-07-08 ENCOUNTER — Inpatient Hospital Stay
Admission: AD | Admit: 2019-07-08 | Payer: BC Managed Care – PPO | Source: Other Acute Inpatient Hospital | Admitting: Cardiovascular Disease

## 2019-07-08 LAB — CBC
HCT: 53.9 % — ABNORMAL HIGH (ref 39.0–52.0)
Hemoglobin: 18.2 g/dL — ABNORMAL HIGH (ref 13.0–17.0)
MCH: 29.2 pg (ref 26.0–34.0)
MCHC: 33.8 g/dL (ref 30.0–36.0)
MCV: 86.5 fL (ref 80.0–100.0)
Platelets: 256 10*3/uL (ref 150–400)
RBC: 6.23 MIL/uL — ABNORMAL HIGH (ref 4.22–5.81)
RDW: 13.2 % (ref 11.5–15.5)
WBC: 9.6 10*3/uL (ref 4.0–10.5)
nRBC: 0 % (ref 0.0–0.2)

## 2019-07-08 LAB — LIPID PANEL
Cholesterol: 133 mg/dL (ref 0–200)
HDL: 27 mg/dL — ABNORMAL LOW (ref >40)
LDL Cholesterol: 65 mg/dL (ref 0–99)
Total CHOL/HDL Ratio: 4.9 RATIO
Triglycerides: 206 mg/dL — ABNORMAL HIGH (ref <150)
VLDL: 41 mg/dL — ABNORMAL HIGH (ref 0–40)

## 2019-07-08 LAB — GLUCOSE, CAPILLARY
Glucose-Capillary: 204 mg/dL — ABNORMAL HIGH (ref 70–99)
Glucose-Capillary: 180 mg/dL — ABNORMAL HIGH (ref 70–99)
Glucose-Capillary: 146 mg/dL — ABNORMAL HIGH (ref 70–99)
Glucose-Capillary: 168 mg/dL — ABNORMAL HIGH (ref 70–99)

## 2019-07-08 LAB — BASIC METABOLIC PANEL
Anion gap: 6 (ref 5–15)
BUN: 17 mg/dL (ref 6–20)
CO2: 23 mmol/L (ref 22–32)
Calcium: 8.4 mg/dL — ABNORMAL LOW (ref 8.9–10.3)
Chloride: 109 mmol/L (ref 98–111)
Creatinine, Ser: 0.83 mg/dL (ref 0.61–1.24)
GFR calc Af Amer: >60 mL/min (ref >60)
GFR calc non Af Amer: >60 mL/min (ref >60)
Glucose, Bld: 137 mg/dL — ABNORMAL HIGH (ref 70–99)
Potassium: 4.2 mmol/L (ref 3.5–5.1)
Sodium: 138 mmol/L (ref 135–145)

## 2019-07-08 NOTE — Plan of Care (Signed)
Patient is still awaiting discharge/transfer to [facility] hospital for intervention at this time, treated for chest pain x 1 this shift.  Problem: Clinical Measurements: Goal: Cardiovascular complication will be avoided Outcome: Progressing   Problem: Coping: Goal: Level of anxiety will decrease Outcome: Progressing   Problem: Pain Managment: Goal: General experience of comfort will improve Outcome: Progressing

## 2019-07-08 NOTE — Discharge Summary (Signed)
[name] NAME: Chris Norris    MR#:  [id number]  DATE OF BIRTH:  11/12/1960  DATE OF ADMISSION:  07/07/2019   ADMITTING PHYSICIAN: Chris Snow, NP  DATE OF DISCHARGE: 07/08/19  PRIMARY CARE PHYSICIAN: Chris Haggard, FNP   ADMISSION DIAGNOSIS:  NSTEMI (non-ST elevated myocardial infarction) ([place]) [I21.4] Chest pain [R07.9] DISCHARGE DIAGNOSIS:  Active Problems:   Chest pain with high risk of acute coronary syndrome  SECONDARY DIAGNOSIS:   Past Medical History:  Diagnosis Date   Coronary artery disease    Diabetes mellitus without complication ([place])    Heart disease    Myocardial infarction ([place])    X 3 STENTS LAST 10 YEARS AGO   Sleep apnea in adult    HOSPITAL COURSE:   Chris Norris is a 58 year old male who presented to the ED with chest pain.  He has a history of CAD s/p 3 stents in the past.  He was admitted for further management.  Unstable angina with a history of CAD s/p stent x3 in the past -Underwent cardiac catheterization on 10/12, which showed 90% stenosis of the distal RCA and 90% stenosis of the mid LAD -Cardiology recommended transfer to [facility] for consideration of possible CABG versus high risk multivessel intervention by Dr. Fletcher Anon tomorrow -Aspirin and metoprolol were continued  Chronic congestive heart failure with midrange EF- patient does not appear volume overloaded -EF during cath was estimated to be between 40-45% -Monitor volume status  Type 2 diabetes -Patient was treated with Lantus and sliding scale insulin  Hyperlipidemia -Uses Repatha every 14 days at home -Panel was pending at the time of discharge  DISCHARGE CONDITIONS:  Unstable angina CAD s/p stent x3 Chronic congestive heart failure with midrange EF Type 2 diabetes Hyperlipidemia CONSULTS OBTAINED:  Treatment Team:  Yolonda Kida, MD DRUG ALLERGIES:  No Known Allergies DISCHARGE MEDICATIONS:    Allergies as of 07/08/2019   No Known Allergies     Medication List    STOP taking these medications   azithromycin 250 MG tablet Commonly known as: ZITHROMAX   Diclofenac Sodium CR 100 MG 24 hr tablet   oxyCODONE 5 MG immediate release tablet Commonly known as: Roxicodone   Repatha 140 MG/ML Sosy Generic drug: Evolocumab     TAKE these medications   aspirin EC 81 MG tablet Take 81 mg by mouth 2 (two) times daily.   insulin aspart 100 UNIT/ML injection Commonly known as: novoLOG Inject 0-10 Units into the skin as needed. FOR HIGH BLOOD SUGAR  USUALLY 2 X MONTH   Toujeo Max SoloStar 300 UNIT/ML Sopn Generic drug: Insulin Glargine Inject 10 Units into the skin 2 (two) times daily.   Vascepa 1 g Caps Generic drug: Icosapent Ethyl Take 2 g by mouth every morning.        DISCHARGE INSTRUCTIONS:  Transferred to [facility] for further intervention DIET:  Cardiac diet and Diabetic diet DISCHARGE CONDITION:  Stable ACTIVITY:  Activity as tolerated OXYGEN:  Home Oxygen: No.  Oxygen Delivery: room air DISCHARGE LOCATION:  To [facility]   If you experience worsening of your admission symptoms, develop shortness of breath, life threatening emergency, suicidal or homicidal thoughts you must seek medical attention immediately by calling 911 or calling your MD immediately  if symptoms less severe.  You Must read complete instructions/literature along with all the possible adverse reactions/side effects for all the Medicines you take and that have been prescribed to you. Take  any new Medicines after you have completely understood and accpet all the possible adverse reactions/side effects.   Please note  You were cared for by a hospitalist during your hospital stay. If you have any questions about your discharge medications or the care you received while you were in the hospital after you are discharged, you can call the unit and asked to speak with the hospitalist on call if the  hospitalist that took care of you is not available. Once you are discharged, your primary care physician will handle any further medical issues. Please note that NO REFILLS for any discharge medications will be authorized once you are discharged, as it is imperative that you return to your primary care physician (or establish a relationship with a primary care physician if you do not have one) for your aftercare needs so that they can reassess your need for medications and monitor your lab values.    On the day of Discharge:  VITAL SIGNS:  Blood pressure 120/86, pulse 63, temperature (!) 97.5 F (36.4 C), temperature source Oral, resp. rate 18, height 5\' 9"  (1.753 m), weight 93.8 kg, SpO2 97 %. PHYSICAL EXAMINATION:  GENERAL:  58 y.o.-year-old patient lying in the bed with no acute distress.  EYES: Pupils equal, round, reactive to light and accommodation. No scleral icterus. Extraocular muscles intact.  HEENT: Head atraumatic, normocephalic. Oropharynx and nasopharynx clear.  NECK:  Supple, no jugular venous distention. No thyroid enlargement, no tenderness.  LUNGS: Normal breath sounds bilaterally, no wheezing, rales,rhonchi or crepitation. No use of accessory muscles of respiration.  CARDIOVASCULAR: RRR, S1, S2 normal. No murmurs, rubs, or gallops.  ABDOMEN: Soft, non-tender, non-distended. Bowel sounds present. No organomegaly or mass.  EXTREMITIES: No pedal edema, cyanosis, or clubbing.  NEUROLOGIC: Cranial nerves II through XII are intact. Muscle strength 5/5 in all extremities. Sensation intact. Gait not checked.  PSYCHIATRIC: The patient is alert and oriented x 3.  SKIN: No obvious rash, lesion, or ulcer.  DATA REVIEW:   CBC Recent Labs  Lab 07/08/19 0524  WBC 9.6  HGB 18.2*  HCT 53.9*  PLT 256    Chemistries  Recent Labs  Lab 07/08/19 0524  NA 138  K 4.2  CL 109  CO2 23  GLUCOSE 137*  BUN 17  CREATININE 0.83  CALCIUM 8.4*     Microbiology Results  Results for  orders placed or performed during the hospital encounter of 07/07/19  SARS Coronavirus 2 by RT PCR (hospital order, performed in [facility] hospital lab) Nasopharyngeal Nasopharyngeal Swab     Status: None   Collection Time: 07/07/19  2:50 AM   Specimen: Nasopharyngeal Swab  Result Value Ref Range Status   SARS Coronavirus 2 NEGATIVE NEGATIVE Final    Comment: (NOTE) If result is NEGATIVE SARS-CoV-2 target nucleic acids are NOT DETECTED. The SARS-CoV-2 RNA is generally detectable in upper and lower  respiratory specimens during the acute phase of infection. The lowest  concentration of SARS-CoV-2 viral copies this assay can detect is 250  copies / mL. A negative result does not preclude SARS-CoV-2 infection  and should not be used as the sole basis for treatment or other  patient management decisions.  A negative result may occur with  improper specimen collection / handling, submission of specimen other  than nasopharyngeal swab, presence of viral mutation(s) within the  areas targeted by this assay, and inadequate number of viral copies  (<250 copies / mL). A negative result must be combined with clinical  observations, patient history, and epidemiological information. If result is POSITIVE SARS-CoV-2 target nucleic acids are DETECTED. The SARS-CoV-2 RNA is generally detectable in upper and lower  respiratory specimens dur ing the acute phase of infection.  Positive  results are indicative of active infection with SARS-CoV-2.  Clinical  correlation with patient history and other diagnostic information is  necessary to determine patient infection status.  Positive results do  not rule out bacterial infection or co-infection with other viruses. If result is PRESUMPTIVE POSTIVE SARS-CoV-2 nucleic acids MAY BE PRESENT.   A presumptive positive result was obtained on the submitted specimen  and confirmed on repeat testing.  While 2019 novel coronavirus  (SARS-CoV-2) nucleic acids may  be present in the submitted sample  additional confirmatory testing may be necessary for epidemiological  and / or clinical management purposes  to differentiate between  SARS-CoV-2 and other Sarbecovirus currently known to infect humans.  If clinically indicated additional testing with an alternate test  methodology [phone]) is advised. The SARS-CoV-2 RNA is generally  detectable in upper and lower respiratory sp ecimens during the acute  phase of infection. The expected result is Negative. Fact Sheet for Patients:  StrictlyIdeas.no Fact Sheet for Healthcare Providers: BankingDealers.co.za This test is not yet approved or cleared by the [place] FDA and has been authorized for detection and/or diagnosis of SARS-CoV-2 by FDA under an Emergency Use Authorization (EUA).  This EUA will remain in effect (meaning this test can be used) for the duration of the COVID-19 declaration under Section 564(b)(1) of the Act, 21 U.S.C. section 360bbb-3(b)(1), unless the authorization is terminated or revoked sooner. Performed at [facility], [address], [place], [place] [address]     RADIOLOGY:  No results found.   Management plans discussed with the patient, family and they are in agreement.  CODE STATUS: Full Code   TOTAL TIME TAKING CARE OF THIS PATIENT: 40 minutes.    Berna Spare Jaze Rodino M.D on 07/08/2019 at 10:54 AM  Between 7am to 6pm - Pager - [phone]  After 6pm go to [email or web address] - Proofreader  [facility]  Office  [phone]  CC: Primary care physician; Chris Haggard, FNP   Note: This dictation was prepared with Dragon dictation along with smaller phrase technology. Any transcriptional errors that result from this process are unintentional.

## 2019-07-09 ENCOUNTER — Inpatient Hospital Stay (HOSPITAL_COMMUNITY)
Admission: EM | Admit: 2019-07-09 | Discharge: 2019-07-10 | DRG: 247 | Disposition: A | Discharge status: Home / Self Care | Payer: BC Managed Care – PPO | Attending: Cardiovascular Disease | Admitting: Cardiovascular Disease

## 2019-07-09 ENCOUNTER — Encounter (HOSPITAL_COMMUNITY)
Admission: EM | Disposition: A | Discharge status: Home / Self Care | Payer: Self-pay | Source: Home / Self Care | Attending: Cardiovascular Disease

## 2019-07-09 DIAGNOSIS — I10 Essential (primary) hypertension: Secondary | ICD-10-CM | POA: Diagnosis present

## 2019-07-09 DIAGNOSIS — E785 Hyperlipidemia, unspecified: Secondary | ICD-10-CM | POA: Diagnosis present

## 2019-07-09 DIAGNOSIS — Z955 Presence of coronary angioplasty implant and graft: Secondary | ICD-10-CM

## 2019-07-09 DIAGNOSIS — Z20828 Contact with and (suspected) exposure to other viral communicable diseases: Secondary | ICD-10-CM | POA: Diagnosis present

## 2019-07-09 DIAGNOSIS — I25110 Atherosclerotic heart disease of native coronary artery with unstable angina pectoris: Secondary | ICD-10-CM | POA: Diagnosis present

## 2019-07-09 DIAGNOSIS — I200 Unstable angina: Secondary | ICD-10-CM | POA: Diagnosis not present

## 2019-07-09 DIAGNOSIS — Z95820 Peripheral vascular angioplasty status with implants and grafts: Secondary | ICD-10-CM

## 2019-07-09 DIAGNOSIS — R079 Chest pain, unspecified: Secondary | ICD-10-CM

## 2019-07-09 DIAGNOSIS — Z794 Long term (current) use of insulin: Secondary | ICD-10-CM

## 2019-07-09 DIAGNOSIS — I214 Non-ST elevation (NSTEMI) myocardial infarction: Secondary | ICD-10-CM | POA: Diagnosis not present

## 2019-07-09 DIAGNOSIS — I2510 Atherosclerotic heart disease of native coronary artery without angina pectoris: Secondary | ICD-10-CM | POA: Diagnosis present

## 2019-07-09 DIAGNOSIS — I252 Old myocardial infarction: Secondary | ICD-10-CM

## 2019-07-09 DIAGNOSIS — E119 Type 2 diabetes mellitus without complications: Secondary | ICD-10-CM | POA: Diagnosis present

## 2019-07-09 DIAGNOSIS — G4733 Obstructive sleep apnea (adult) (pediatric): Secondary | ICD-10-CM | POA: Diagnosis present

## 2019-07-09 HISTORY — PX: CORONARY STENT INTERVENTION: CATH118234

## 2019-07-09 LAB — CBC
HCT: 54.8 % — ABNORMAL HIGH (ref 39.0–52.0)
Hemoglobin: 18.1 g/dL — ABNORMAL HIGH (ref 13.0–17.0)
MCH: 28.4 pg (ref 26.0–34.0)
MCHC: 33 g/dL (ref 30.0–36.0)
MCV: 86 fL (ref 80.0–100.0)
Platelets: 283 10*3/uL (ref 150–400)
RBC: 6.37 MIL/uL — ABNORMAL HIGH (ref 4.22–5.81)
RDW: 13.2 % (ref 11.5–15.5)
WBC: 11.2 10*3/uL — ABNORMAL HIGH (ref 4.0–10.5)
nRBC: 0 % (ref 0.0–0.2)

## 2019-07-09 LAB — GLUCOSE, CAPILLARY
Glucose-Capillary: 157 mg/dL — ABNORMAL HIGH (ref 70–99)
Glucose-Capillary: 142 mg/dL — ABNORMAL HIGH (ref 70–99)
Glucose-Capillary: 89 mg/dL (ref 70–99)
Glucose-Capillary: 127 mg/dL — ABNORMAL HIGH (ref 70–99)

## 2019-07-09 LAB — POCT ACTIVATED CLOTTING TIME
Activated Clotting Time: 483 seconds
Activated Clotting Time: 246 seconds
Activated Clotting Time: 434 seconds

## 2019-07-09 SURGERY — CORONARY STENT INTERVENTION
Anesthesia: LOCAL

## 2019-07-09 MED ORDER — ACETAMINOPHEN 325 MG PO TABS
650.0000 mg | ORAL_TABLET | ORAL | Status: DC | PRN
  Administered 2019-07-09: 650 mg via ORAL
  Filled 2019-07-09: qty 2

## 2019-07-09 MED ORDER — ONDANSETRON HCL 4 MG/2ML IJ SOLN: 4.0000 mg | Freq: Four times a day (QID) | INTRAMUSCULAR | Status: DC | PRN

## 2019-07-09 MED ORDER — HEPARIN (PORCINE) IN NACL 1000-0.9 UT/500ML-% IV SOLN
INTRAVENOUS | Status: AC
  Filled 2019-07-09: qty 1000

## 2019-07-09 MED ORDER — CLOPIDOGREL BISULFATE 300 MG PO TABS
ORAL_TABLET | ORAL | Status: AC
  Filled 2019-07-09: qty 2

## 2019-07-09 MED ORDER — LIDOCAINE HCL (PF) 1 % IJ SOLN
INTRAMUSCULAR | Status: DC | PRN
  Administered 2019-07-09: 5 mL

## 2019-07-09 MED ORDER — MIDAZOLAM HCL 2 MG/2ML IJ SOLN
INTRAMUSCULAR | Status: AC
  Filled 2019-07-09: qty 2

## 2019-07-09 MED ORDER — ICOSAPENT ETHYL 1 G PO CAPS
2.0000 g | ORAL_CAPSULE | Freq: Every day | ORAL | Status: DC
  Filled 2019-07-09: qty 2

## 2019-07-09 MED ORDER — VERAPAMIL HCL 2.5 MG/ML IV SOLN
INTRAVENOUS | Status: AC
  Filled 2019-07-09: qty 2

## 2019-07-09 MED ORDER — FENTANYL CITRATE (PF) 100 MCG/2ML IJ SOLN
INTRAMUSCULAR | Status: AC
  Filled 2019-07-09: qty 2

## 2019-07-09 MED ORDER — CLOPIDOGREL BISULFATE 75 MG PO TABS
75.0000 mg | ORAL_TABLET | Freq: Every day | ORAL | Status: DC
  Administered 2019-07-10: 75 mg via ORAL
  Filled 2019-07-09: qty 1

## 2019-07-09 MED ORDER — CLOPIDOGREL BISULFATE 300 MG PO TABS
ORAL_TABLET | ORAL | Status: DC | PRN
  Administered 2019-07-09: 600 mg via ORAL

## 2019-07-09 MED ORDER — HEPARIN SODIUM (PORCINE) 1000 UNIT/ML IJ SOLN
INTRAMUSCULAR | Status: DC | PRN
  Administered 2019-07-09: 10000 [IU] via INTRAVENOUS
  Administered 2019-07-09: 2000 [IU] via INTRAVENOUS

## 2019-07-09 MED ORDER — SODIUM CHLORIDE 0.9% FLUSH: 3.0000 mL | INTRAVENOUS | Status: DC | PRN

## 2019-07-09 MED ORDER — HEPARIN (PORCINE) IN NACL 1000-0.9 UT/500ML-% IV SOLN
INTRAVENOUS | Status: DC | PRN
  Administered 2019-07-09 (×2): 500 mL

## 2019-07-09 MED ORDER — IOHEXOL 350 MG/ML SOLN
INTRAVENOUS | Status: DC | PRN
  Administered 2019-07-09: 250 mL

## 2019-07-09 MED ORDER — ASPIRIN EC 81 MG PO TBEC
81.0000 mg | DELAYED_RELEASE_TABLET | Freq: Two times a day (BID) | ORAL | Status: DC
  Administered 2019-07-09 – 2019-07-10 (×2): 81 mg via ORAL
  Filled 2019-07-09 (×2): qty 1

## 2019-07-09 MED ORDER — SODIUM CHLORIDE 0.9% FLUSH
3.0000 mL | Freq: Two times a day (BID) | INTRAVENOUS | Status: DC
  Administered 2019-07-10: 3 mL via INTRAVENOUS

## 2019-07-09 MED ORDER — FENTANYL CITRATE (PF) 100 MCG/2ML IJ SOLN
INTRAMUSCULAR | Status: DC | PRN
  Administered 2019-07-09 (×3): 50 ug via INTRAVENOUS

## 2019-07-09 MED ORDER — HEPARIN SODIUM (PORCINE) 1000 UNIT/ML IJ SOLN
INTRAMUSCULAR | Status: AC
  Filled 2019-07-09: qty 1

## 2019-07-09 MED ORDER — INSULIN GLARGINE 100 UNIT/ML ~~LOC~~ SOLN
10.0000 [IU] | Freq: Two times a day (BID) | SUBCUTANEOUS | Status: DC
  Administered 2019-07-09 – 2019-07-10 (×2): 10 [IU] via SUBCUTANEOUS
  Filled 2019-07-09 (×3): qty 0.1

## 2019-07-09 MED ORDER — NITROGLYCERIN 1 MG/10 ML FOR IR/CATH LAB
INTRA_ARTERIAL | Status: AC
  Filled 2019-07-09: qty 10

## 2019-07-09 MED ORDER — SODIUM CHLORIDE 0.9 % IV SOLN: INTRAVENOUS | Status: AC

## 2019-07-09 MED ORDER — MORPHINE SULFATE (PF) 2 MG/ML IV SOLN: 2.0000 mg | Freq: Once | INTRAVENOUS | Status: DC

## 2019-07-09 MED ORDER — ENOXAPARIN SODIUM 40 MG/0.4ML ~~LOC~~ SOLN
40.0000 mg | SUBCUTANEOUS | Status: DC
  Administered 2019-07-10: 40 mg via SUBCUTANEOUS
  Filled 2019-07-09: qty 0.4

## 2019-07-09 MED ORDER — VERAPAMIL HCL 2.5 MG/ML IV SOLN
INTRAVENOUS | Status: DC | PRN
  Administered 2019-07-09: 10 mL via INTRA_ARTERIAL

## 2019-07-09 MED ORDER — LIDOCAINE HCL (PF) 1 % IJ SOLN
INTRAMUSCULAR | Status: AC
  Filled 2019-07-09: qty 30

## 2019-07-09 MED ORDER — MIDAZOLAM HCL 2 MG/2ML IJ SOLN
INTRAMUSCULAR | Status: DC | PRN
  Administered 2019-07-09 (×2): 2 mg via INTRAVENOUS
  Administered 2019-07-09: 1 mg via INTRAVENOUS

## 2019-07-09 MED ORDER — SODIUM CHLORIDE 0.9 % IV SOLN: 250.0000 mL | INTRAVENOUS | Status: DC | PRN

## 2019-07-09 MED ORDER — ASPIRIN 81 MG PO CHEW
CHEWABLE_TABLET | ORAL | Status: DC | PRN
  Administered 2019-07-09: 81 mg via ORAL

## 2019-07-09 SURGICAL SUPPLY — 25 items
BALLN SAPPHIRE 2.0X12 (BALLOONS) ×2
BALLN SAPPHIRE 2.0X15 (BALLOONS) ×2
BALLN SAPPHIRE 2.5X12 (BALLOONS) ×2
BALLN SAPPHIRE ~~LOC~~ 2.5X12 (BALLOONS) ×2 IMPLANT
BALLN SAPPHIRE ~~LOC~~ 4.0X18 (BALLOONS) ×2 IMPLANT
BALLOON SAPPHIRE 2.0X12 (BALLOONS) ×1 IMPLANT
BALLOON SAPPHIRE 2.0X15 (BALLOONS) ×1 IMPLANT
BALLOON SAPPHIRE 2.5X12 (BALLOONS) ×1 IMPLANT
CATH LAUNCHER 6FR EBU3.5 (CATHETERS) ×2 IMPLANT
CATH VISTA GUIDE 6FR JR4 (CATHETERS) ×2 IMPLANT
DEVICE RAD COMP TR BAND LRG (VASCULAR PRODUCTS) ×2 IMPLANT
ELECT DEFIB PAD ADLT CADENCE (PAD) ×2 IMPLANT
GLIDESHEATH SLEND SS 6F .021 (SHEATH) ×2 IMPLANT
GUIDEWIRE INQWIRE 1.5J.035X260 (WIRE) ×1 IMPLANT
INQWIRE 1.5J .035X260CM (WIRE) ×2
KIT ENCORE 26 ADVANTAGE (KITS) ×2 IMPLANT
KIT HEART LEFT (KITS) ×2 IMPLANT
PACK CARDIAC CATHETERIZATION (CUSTOM PROCEDURE TRAY) ×2 IMPLANT
STENT RESOLUTE ONYX 2.25X22 (Permanent Stent) ×2 IMPLANT
STENT RESOLUTE ONYX 2.5X18 (Permanent Stent) ×2 IMPLANT
STENT RESOLUTE ONYX 3.5X38 (Permanent Stent) ×2 IMPLANT
TRANSDUCER W/STOPCOCK (MISCELLANEOUS) ×2 IMPLANT
TUBING CIL FLEX 10 FLL-RA (TUBING) ×2 IMPLANT
WIRE RUNTHROUGH .014X180CM (WIRE) ×2 IMPLANT
WIRE RUNTHROUGH .014X300CM (WIRE) ×2 IMPLANT

## 2019-07-09 NOTE — Interval H&P Note (Signed)
Cath Lab Visit (complete for each Cath Lab visit)  Clinical Evaluation Leading to the Procedure:   ACS: Yes.     Non-ACS:  n/a    History and Physical Interval Note:  The patient has known history of coronary artery disease with remote stenting.  He has prolonged history of diabetes mellitus.  He presented to [facility] with symptoms of unstable angina.  He underwent cardiac catheterization by Dr. Clayborn Bigness on Monday which showed severe two-vessel coronary artery disease including the LAD and right coronary artery.  The coronary arteries were overall diffusely diseased consistent with diabetic vessels.  I was asked to review the catheterization images and I felt that CABG is likely the better option given diffuse disease and diabetic status.  However, the patient preferred PCI if it is technically possible.  The RCA has 2 areas of disease and will require at least 2 stents.  The LAD stenosis is at the bifurcation of the diagonal which is as big as the LAD itself.  The LAD is actually diffusely diseased and very small in caliber after the stenosis.  I reviewed the images with Dr. Burt Knack regarding PCI.  The LAD diagonal PCI will likely require stenting of the diagonal which is a healthier vessel than the LAD itself and probably balloon angioplasty of the LAD with avoiding placing a stent given small diameter at the distal lesion. I explained to the patient that given his diabetic status and diffuse disease, there is high risk for restenosis in the future.  I discussed the procedure in details as well as risks and benefits and he wants to proceed with PCI.  07/09/2019 1:58 PM  Chris Norris  has presented today for surgery, with the diagnosis of cad.  The various methods of treatment have been discussed with the patient and family. After consideration of risks, benefits and other options for treatment, the patient has consented to  Procedure(s): CORONARY STENT INTERVENTION (N/A) as a surgical intervention.   The patient's history has been reviewed, patient examined, no change in status, stable for surgery.  I have reviewed the patient's chart and labs.  Questions were answered to the patient's satisfaction.     Kathlyn [place]

## 2019-07-09 NOTE — Discharge Summary (Signed)
[facility] [place] NAME: Chris Norris    MR#:  [id number]  DATE OF BIRTH:  Feb 06, 1961  DATE OF ADMISSION:  07/07/2019   ADMITTING PHYSICIAN: Chris Snow, NP  DATE OF DISCHARGE: 07/09/19  PRIMARY CARE PHYSICIAN: Chris Haggard, FNP   ADMISSION DIAGNOSIS:  NSTEMI (non-ST elevated myocardial infarction) ([place]) [I21.4] Chest pain [R07.9] DISCHARGE DIAGNOSIS:  Active Problems:   Chest pain with high risk of acute coronary syndrome  SECONDARY DIAGNOSIS:   Past Medical History:  Diagnosis Date   Coronary artery disease    Diabetes mellitus without complication ([place])    Heart disease    Myocardial infarction ([place])    X 3 STENTS LAST 10 YEARS AGO   Sleep apnea in adult    HOSPITAL COURSE:   Chris Norris is a 58 year old male who presented to the ED with chest pain.  He has a history of CAD s/p 3 stents in the past.  He was admitted for further management.  Unstable angina with a history of CAD s/p stent x3 in the past -Underwent cardiac catheterization on 10/12, which showed 90% stenosis of the distal RCA and 90% stenosis of the mid LAD -Cardiology recommended transfer to [facility] (directly to cath lab) for high risk multivessel intervention by Dr. Fletcher Norris this afternoon -Aspirin and metoprolol were continued -Will keep patient NPO  Chronic congestive heart failure with midrange EF- patient does not appear volume overloaded -EF during cath was estimated to be between 40-45% -Monitor volume status  Type 2 diabetes -Patient was treated with Lantus and sliding scale insulin  Hyperlipidemia -Uses Repatha every 14 days at home -Lipid panel with LDL 65  DISCHARGE CONDITIONS:  Unstable angina CAD s/p stent x3 Chronic congestive heart failure with midrange EF Type 2 diabetes Hyperlipidemia CONSULTS OBTAINED:  Treatment Team:  Chris Kida, MD DRUG ALLERGIES:  No Known Allergies DISCHARGE MEDICATIONS:    Allergies as of 07/09/2019   No Known Allergies     Medication List    STOP taking these medications   azithromycin 250 MG tablet Commonly known as: ZITHROMAX   Diclofenac Sodium CR 100 MG 24 hr tablet   oxyCODONE 5 MG immediate release tablet Commonly known as: Roxicodone   Repatha 140 MG/ML Sosy Generic drug: Evolocumab     TAKE these medications   aspirin EC 81 MG tablet Take 81 mg by mouth 2 (two) times daily.   insulin aspart 100 UNIT/ML injection Commonly known as: novoLOG Inject 0-10 Units into the skin as needed. FOR HIGH BLOOD SUGAR  USUALLY 2 X MONTH   Toujeo Max SoloStar 300 UNIT/ML Sopn Generic drug: Insulin Glargine Inject 10 Units into the skin 2 (two) times daily.   Vascepa 1 g Caps Generic drug: Icosapent Ethyl Take 2 g by mouth every morning.        DISCHARGE INSTRUCTIONS:  Transferred to [facility] for further intervention DIET:  Cardiac diet and Diabetic diet DISCHARGE CONDITION:  Stable ACTIVITY:  Activity as tolerated OXYGEN:  Home Oxygen: No.  Oxygen Delivery: room air DISCHARGE LOCATION:  To [facility]   If you experience worsening of your admission symptoms, develop shortness of breath, life threatening emergency, suicidal or homicidal thoughts you must seek medical attention immediately by calling 911 or calling your MD immediately  if symptoms less severe.  You Must read complete instructions/literature along with all the possible adverse reactions/side effects for all the Medicines you take and that have been prescribed to you.  Take any new Medicines after you have completely understood and accpet all the possible adverse reactions/side effects.   Please note  You were cared for by a hospitalist during your hospital stay. If you have any questions about your discharge medications or the care you received while you were in the hospital after you are discharged, you can call the unit and asked to speak with the hospitalist on call if the  hospitalist that took care of you is not available. Once you are discharged, your primary care physician will handle any further medical issues. Please note that NO REFILLS for any discharge medications will be authorized once you are discharged, as it is imperative that you return to your primary care physician (or establish a relationship with a primary care physician if you do not have one) for your aftercare needs so that they can reassess your need for medications and monitor your lab values.    On the day of Discharge:  VITAL SIGNS:  Blood pressure 118/75, pulse 66, temperature 97.6 F (36.4 C), temperature source Oral, resp. rate 18, height 5\' 9"  (1.753 m), weight 92.6 kg, SpO2 96 %. PHYSICAL EXAMINATION:  GENERAL:  58 y.o.-year-old patient lying in the bed with no acute distress.  EYES: Pupils equal, round, reactive to light and accommodation. No scleral icterus. Extraocular muscles intact.  HEENT: Head atraumatic, normocephalic. Oropharynx and nasopharynx clear.  NECK:  Supple, no jugular venous distention. No thyroid enlargement, no tenderness.  LUNGS: Normal breath sounds bilaterally, no wheezing, rales,rhonchi or crepitation. No use of accessory muscles of respiration.  CARDIOVASCULAR: RRR, S1, S2 normal. No murmurs, rubs, or gallops.  ABDOMEN: Soft, non-tender, non-distended. Bowel sounds present. No organomegaly or mass.  EXTREMITIES: No pedal edema, cyanosis, or clubbing.  NEUROLOGIC: Cranial nerves II through XII are intact. Muscle strength 5/5 in all extremities. Sensation intact. Gait not checked.  PSYCHIATRIC: The patient is alert and oriented x 3.  SKIN: No obvious rash, lesion, or ulcer.  DATA REVIEW:   CBC Recent Labs  Lab 07/09/19 0528  WBC 11.2*  HGB 18.1*  HCT 54.8*  PLT 283    Chemistries  Recent Labs  Lab 07/08/19 0524  NA 138  K 4.2  CL 109  CO2 23  GLUCOSE 137*  BUN 17  CREATININE 0.83  CALCIUM 8.4*     Microbiology Results  Results for  orders placed or performed during the hospital encounter of 07/07/19  SARS Coronavirus 2 by RT PCR (hospital order, performed in [facility] hospital lab) Nasopharyngeal Nasopharyngeal Swab     Status: None   Collection Time: 07/07/19  2:50 AM   Specimen: Nasopharyngeal Swab  Result Value Ref Range Status   SARS Coronavirus 2 NEGATIVE NEGATIVE Final    Comment: (NOTE) If result is NEGATIVE SARS-CoV-2 target nucleic acids are NOT DETECTED. The SARS-CoV-2 RNA is generally detectable in upper and lower  respiratory specimens during the acute phase of infection. The lowest  concentration of SARS-CoV-2 viral copies this assay can detect is 250  copies / mL. A negative result does not preclude SARS-CoV-2 infection  and should not be used as the sole basis for treatment or other  patient management decisions.  A negative result may occur with  improper specimen collection / handling, submission of specimen other  than nasopharyngeal swab, presence of viral mutation(s) within the  areas targeted by this assay, and inadequate number of viral copies  (<250 copies / mL). A negative result must be combined with clinical  observations, patient history, and epidemiological information. If result is POSITIVE SARS-CoV-2 target nucleic acids are DETECTED. The SARS-CoV-2 RNA is generally detectable in upper and lower  respiratory specimens dur ing the acute phase of infection.  Positive  results are indicative of active infection with SARS-CoV-2.  Clinical  correlation with patient history and other diagnostic information is  necessary to determine patient infection status.  Positive results do  not rule out bacterial infection or co-infection with other viruses. If result is PRESUMPTIVE POSTIVE SARS-CoV-2 nucleic acids MAY BE PRESENT.   A presumptive positive result was obtained on the submitted specimen  and confirmed on repeat testing.  While 2019 novel coronavirus  (SARS-CoV-2) nucleic acids may  be present in the submitted sample  additional confirmatory testing may be necessary for epidemiological  and / or clinical management purposes  to differentiate between  SARS-CoV-2 and other Sarbecovirus currently known to infect humans.  If clinically indicated additional testing with an alternate test  methodology [phone]) is advised. The SARS-CoV-2 RNA is generally  detectable in upper and lower respiratory sp ecimens during the acute  phase of infection. The expected result is Negative. Fact Sheet for Patients:  StrictlyIdeas.no Fact Sheet for Healthcare Providers: BankingDealers.co.za This test is not yet approved or cleared by the [place] FDA and has been authorized for detection and/or diagnosis of SARS-CoV-2 by FDA under an Emergency Use Authorization (EUA).  This EUA will remain in effect (meaning this test can be used) for the duration of the COVID-19 declaration under Section 564(b)(1) of the Act, 21 U.S.C. section 360bbb-3(b)(1), unless the authorization is terminated or revoked sooner. Performed at [facility], [address], [place], [place] [address]     RADIOLOGY:  No results found.   Management plans discussed with the patient, family and they are in agreement.  CODE STATUS: Full Code   TOTAL TIME TAKING CARE OF THIS PATIENT: 40 minutes.    Berna Spare Daryan Cagley M.D on 07/09/2019 at 11:47 AM  Between 7am to 6pm - Pager - [phone]  After 6pm go to [email or web address] - Proofreader  [facility] [place]  Office  [phone]  CC: Primary care physician; Chris Haggard, FNP   Note: This dictation was prepared with Dragon dictation along with smaller phrase technology. Any transcriptional errors that result from this process are unintentional.

## 2019-07-09 NOTE — Plan of Care (Signed)
  Problem: Activity: Goal: Ability to return to baseline activity level will improve Outcome: Progressing   Problem: Education: Goal: Understanding of CV disease, CV risk reduction, and recovery process will improve Outcome: Progressing Goal: Individualized Educational Video(s) Outcome: Progressing   

## 2019-07-10 ENCOUNTER — Encounter (HOSPITAL_COMMUNITY): Payer: Self-pay | Admitting: Cardiovascular Disease

## 2019-07-10 DIAGNOSIS — Z794 Long term (current) use of insulin: Secondary | ICD-10-CM

## 2019-07-10 DIAGNOSIS — I10 Essential (primary) hypertension: Secondary | ICD-10-CM | POA: Diagnosis present

## 2019-07-10 DIAGNOSIS — Z95820 Peripheral vascular angioplasty status with implants and grafts: Secondary | ICD-10-CM

## 2019-07-10 DIAGNOSIS — E785 Hyperlipidemia, unspecified: Secondary | ICD-10-CM

## 2019-07-10 DIAGNOSIS — E119 Type 2 diabetes mellitus without complications: Secondary | ICD-10-CM

## 2019-07-10 DIAGNOSIS — I200 Unstable angina: Secondary | ICD-10-CM

## 2019-07-10 HISTORY — DX: Type 2 diabetes mellitus without complications: E11.9

## 2019-07-10 HISTORY — DX: Long term (current) use of insulin: Z79.4

## 2019-07-10 LAB — CBC
HCT: 56.1 % — ABNORMAL HIGH (ref 39.0–52.0)
Hemoglobin: 19.2 g/dL — ABNORMAL HIGH (ref 13.0–17.0)
MCH: 29.5 pg (ref 26.0–34.0)
MCHC: 34.2 g/dL (ref 30.0–36.0)
MCV: 86.3 fL (ref 80.0–100.0)
Platelets: 253 10*3/uL (ref 150–400)
RBC: 6.5 MIL/uL — ABNORMAL HIGH (ref 4.22–5.81)
RDW: 13.6 % (ref 11.5–15.5)
WBC: 9.9 10*3/uL (ref 4.0–10.5)
nRBC: 0 % (ref 0.0–0.2)

## 2019-07-10 LAB — GLUCOSE, CAPILLARY: Glucose-Capillary: 132 mg/dL — ABNORMAL HIGH (ref 70–99)

## 2019-07-10 LAB — BASIC METABOLIC PANEL
Anion gap: 13 (ref 5–15)
BUN: 19 mg/dL (ref 6–20)
CO2: 17 mmol/L — ABNORMAL LOW (ref 22–32)
Calcium: 8.7 mg/dL — ABNORMAL LOW (ref 8.9–10.3)
Chloride: 105 mmol/L (ref 98–111)
Creatinine, Ser: 1.05 mg/dL (ref 0.61–1.24)
GFR calc Af Amer: >60 mL/min (ref >60)
GFR calc non Af Amer: >60 mL/min (ref >60)
Glucose, Bld: 95 mg/dL (ref 70–99)
Potassium: 4 mmol/L (ref 3.5–5.1)
Sodium: 135 mmol/L (ref 135–145)

## 2019-07-10 MED ORDER — NITROGLYCERIN 0.4 MG SL SUBL: 0.4000 mg | SUBLINGUAL_TABLET | SUBLINGUAL | Status: DC | PRN

## 2019-07-10 MED ORDER — METOPROLOL SUCCINATE ER 25 MG PO TB24: 25.0000 mg | ORAL_TABLET | Freq: Every day | ORAL | 6 refills | Status: DC

## 2019-07-10 MED ORDER — ACETAMINOPHEN 325 MG PO TABS: 650.0000 mg | ORAL_TABLET | ORAL | Status: AC | PRN

## 2019-07-10 MED ORDER — ASPIRIN EC 81 MG PO TBEC: 81.0000 mg | DELAYED_RELEASE_TABLET | Freq: Every day | ORAL | Status: AC

## 2019-07-10 MED ORDER — POLYETHYLENE GLYCOL 3350 17 G PO PACK
17.0000 g | PACK | Freq: Every day | ORAL | Status: DC
  Administered 2019-07-10: 17 g via ORAL
  Filled 2019-07-10: qty 1

## 2019-07-10 MED ORDER — CLOPIDOGREL BISULFATE 75 MG PO TABS: 75.0000 mg | ORAL_TABLET | Freq: Every day | ORAL | 11 refills | Status: DC

## 2019-07-10 MED ORDER — OMEGA-3-ACID ETHYL ESTERS 1 G PO CAPS
2.0000 g | ORAL_CAPSULE | Freq: Every day | ORAL | Status: DC
  Administered 2019-07-10: 2 g via ORAL
  Filled 2019-07-10: qty 2

## 2019-07-10 MED ORDER — METOPROLOL SUCCINATE ER 25 MG PO TB24
25.0000 mg | ORAL_TABLET | Freq: Every day | ORAL | Status: DC
  Administered 2019-07-10: 25 mg via ORAL
  Filled 2019-07-10: qty 1

## 2019-07-10 MED ORDER — EZETIMIBE 10 MG PO TABS
10.0000 mg | ORAL_TABLET | Freq: Every day | ORAL | Status: DC
  Administered 2019-07-10: 10 mg via ORAL
  Filled 2019-07-10: qty 1

## 2019-07-10 MED ORDER — NITROGLYCERIN 0.4 MG SL SUBL: 0.4000 mg | SUBLINGUAL_TABLET | SUBLINGUAL | 4 refills | Status: AC | PRN

## 2019-07-10 MED ORDER — EZETIMIBE 10 MG PO TABS: 10.0000 mg | ORAL_TABLET | Freq: Every day | ORAL | 6 refills | Status: DC

## 2019-07-10 MED FILL — Nitroglycerin IV Soln 100 MCG/ML in D5W: INTRA_ARTERIAL | Qty: 10 | Status: AC

## 2019-07-10 NOTE — Discharge Summary (Signed)
Discharge Summary    Patient ID: AVIER Norris MRN: [id number]; DOB: Jan 27, 1961  Admit date: 07/09/2019 Discharge date: 07/10/2019  Primary Care Provider: Remi Haggard, FNP  Primary Cardiologist: Teodoro Spray, MD  Primary Electrophysiologist:  None   Discharge Diagnoses    Principal Problem:   Unstable angina [name]) Active Problems:   Coronary artery disease   S/P angioplasty with stent, 2 stents in RCA and 1 DES in mLAD 07/09/19    HLD (hyperlipidemia)   HTN (hypertension)   Type 2 diabetes mellitus without complication, with long-term current use of insulin (HCC)   Allergies No Known Allergies  Diagnostic Studies/Procedures    Cardiac cath at [facility] with Dr. Clayborn Bigness. PCI at [facility]   Prox RCA lesion is 20% stenosed.  RPAV lesion is 20% stenosed.  Dist RCA lesion is 95% stenosed.  Post intervention, there is a 0% residual stenosis.  A drug-eluting stent was successfully placed using a STENT RESOLUTE ONYX 2.5X18.  Prox RCA to Mid RCA lesion is 90% stenosed.  Post intervention, there is a 0% residual stenosis.  A drug-eluting stent was successfully placed using a STENT RESOLUTE ONYX 3.5X38.  Prox LAD to Mid LAD lesion is 20% stenosed.  Mid LAD lesion is 85% stenosed.  2nd Diag lesion is 80% stenosed.  Post intervention, there is a 0% residual stenosis.  A drug-eluting stent was successfully placed using a STENT RESOLUTE ONYX 2.25X22.  Post intervention, there is a 0% residual stenosis.  Balloon angioplasty was performed using a BALLOON SAPPHIRE 2.0X12.  Mid LAD to Dist LAD lesion is 60% stenosed.  1. Successful complex high risk PCI and drug-eluting stent placement to the distal RCA and proximal to mid RCA with 2 separate stents. 2. Successful bifurcation PCI of the LAD/diagonal. The stent was placed into the diagonal back to the LAD. Balloon angioplasty was performed on the ostium of the LAD to open the stent struts but the rest of the  diffuse 60% disease was left to be treated medically given that the whole LAD appeared disease.  Recommendations: Dual antiplatelet therapy for at least 1 year and ideally indefinitely. Aggressive treatment of risk factors. The patient has history of intolerance to statins but he is on Repatha. Possible discharge home tomorrow. The patient's primary cardiologist is Dr. Ubaldo Glassing in [place].  Diagnostic Dominance: Right  Intervention    Echo 07/07/19  IMPRESSIONS   1. Left ventricular ejection fraction, by visual estimation, is 50 to 55%. The left ventricle has normal function. There is no left ventricular hypertrophy. 2. Global right ventricle has normal systolic function.The right ventricular size is normal. No increase in right ventricular wall thickness. 3. Left atrial size was normal. 4. Right atrial size was not assessed. 5. The mitral valve is normal in structure. No evidence of mitral valve regurgitation. 6. The tricuspid valve is normal in structure. Tricuspid valve regurgitation was not visualized by color flow Doppler. 7. The aortic valve is normal in structure. Aortic valve regurgitation was not visualized by color flow Doppler. 8. The pulmonic valve was grossly normal. Pulmonic valve regurgitation is not visualized by color flow Doppler. 9. Aortic root could not be assessed.  FINDINGS Left Ventricle: Left ventricular ejection fraction, by visual estimation, is 50 to 55%. The left ventricle has normal function. There is no left ventricular hypertrophy.  Right Ventricle: The right ventricular size is normal. No increase in right ventricular wall thickness. Global RV systolic function is has normal systolic function.  Left Atrium: Left atrial  size was normal in size.  Right Atrium: Right atrial size was not assessed  Pericardium: There is no evidence of pericardial effusion.  Mitral Valve: The mitral valve is normal in structure. MV peak  gradient, 2.4 mmHg. No evidence of mitral valve regurgitation.  Tricuspid Valve: The tricuspid valve is normal in structure. Tricuspid valve regurgitation was not visualized by color flow Doppler.  Aortic Valve: The aortic valve is normal in structure. Aortic valve regurgitation was not visualized by color flow Doppler. Aortic valve mean gradient measures 6.0 mmHg. Aortic valve peak gradient measures 11.3 mmHg. Aortic valve area, by VTI measures  1.90 cm.  Pulmonic Valve: The pulmonic valve was grossly normal. Pulmonic valve regurgitation is not visualized by color flow Doppler.  Aorta: Aortic root could not be assessed.  IAS/Shunts: No atrial level shunt detected by color flow Doppler.   _____________   History of Present Illness     Chris Norris is a 58 y.o. male with  known CAD, DM, HTN, HLD admitted to [place] with chest pain similar to pain with prior stents.  Hx of stents X 3 last 10 years prior to this admit.  [facility] pain that began day before admit.  With lt ear pain radiation to left jaw, neck, down his arm then into chest.  Similar to prior cardiac pain.  No associated symptoms.  Labs normal with troponin 18 and repeat 50.  Covid neg.    He was admitted for further eval. BB added.   Hospital Course     Consultants: cardiology   Pt was seen by Dr. Vida Roller and underwent cardiac cath with significant disease.  CABG was suggested but pt did not wish to have that done so he was transferred to [facility] for cardiac cath/PCI.  It was high risk procedure but pt preferred over CABG.   Pt did well with PCI and was admitted to floor.  He did well overnight and today EKG is stable.  Labs are normal.  He does not wish to proceed with cardiac rehab.  He will return to work on Monday, we added zetia to meds along with BB.  He has ambulated in hall and did well.  He has been seen and evaluated by Dr. Claiborne Billings and found stable for discharge.   Did the  patient have an acute coronary syndrome (MI, NSTEMI, STEMI, etc) this admission?:  Yes                               AHA/ACC Clinical Performance & Quality Measures: 1. Aspirin prescribed? - Yes 2. ADP Receptor Inhibitor (Plavix/Clopidogrel, Brilinta/Ticagrelor or Effient/Prasugrel) prescribed (includes medically managed patients)? - Yes 3. Beta Blocker prescribed? - Yes 4. High Intensity Statin (Lipitor 40-80mg  or Crestor 20-40mg ) prescribed? - Yes 5. EF assessed during THIS hospitalization? - Yes 6. For EF <40%, was ACEI/ARB prescribed? - No - Reason:  na 7. For EF <40%, Aldosterone Antagonist (Spironolactone or Eplerenone) prescribed? - Not Applicable (EF >/= [id number]) 8. Cardiac Rehab Phase II ordered (Included Medically managed Patients)? - Yes   _____________  Discharge Vitals Blood pressure 123/88, pulse 71, temperature (!) 97.5 F (36.4 C), temperature source Oral, resp. rate 18, weight 92 kg, SpO2 97 %.  Filed Weights   07/10/19 0628  Weight: 92 kg    Labs & Radiologic Studies    CBC Recent Labs    07/09/19 0528 07/10/19 0353  WBC 11.2*  9.9  HGB 18.1* 19.2*  HCT 54.8* 56.1*  MCV 86.0 86.3  PLT 283 123456   Basic Metabolic Panel Recent Labs    07/08/19 0524 07/10/19 0353  NA 138 135  K 4.2 4.0  CL 109 105  CO2 23 17*  GLUCOSE 137* 95  BUN 17 19  CREATININE 0.83 1.05  CALCIUM 8.4* 8.7*   Liver Function Tests No results for input(s): AST, ALT, ALKPHOS, BILITOT, PROT, ALBUMIN in the last 72 hours. No results for input(s): LIPASE, AMYLASE in the last 72 hours. High Sensitivity Troponin:   Recent Labs  Lab 07/07/19 0003 07/07/19 0155 07/07/19 0641  TROPONINIHS 18* 50* 60*    BNP Invalid input(s): POCBNP D-Dimer No results for input(s): DDIMER in the last 72 hours. Hemoglobin A1C No results for input(s): HGBA1C in the last 72 hours. Fasting Lipid Panel Recent Labs    07/08/19 0524  CHOL 133  HDL 27*  LDLCALC 65  TRIG 206*  CHOLHDL 4.9   Thyroid  Function Tests No results for input(s): TSH, T4TOTAL, T3FREE, THYROIDAB in the last 72 hours.  Invalid input(s): FREET3 _____________  Dg Chest 1 View  Result Date: 07/07/2019 CLINICAL DATA:  Mid chest pain for 1 hour, history of prior MI EXAM: CHEST  1 VIEW COMPARISON:  Radiograph 01/18/2019, CT 01/03/2013 FINDINGS: Chronic interstitial changes similar to priors. No consolidation, features of edema, pneumothorax, or effusion. Pulmonary vascularity is normally distributed. The cardiomediastinal contours are unremarkable. No acute osseous or soft tissue abnormality. IMPRESSION: Chronic interstitial changes.  No acute cardiopulmonary abnormality. Electronically Signed   By: Lovena Le M.D.   On: 07/07/2019 00:34   Disposition   Pt is being discharged home today in good condition.  Follow-up Plans & Appointments   Take 1 NTG, under your tongue, while sitting.  If no relief of pain may repeat NTG, one tab every 5 minutes up to 3 tablets total over 15 minutes.  If no relief CALL 911.  If you have dizziness/lightheadness  while taking NTG, stop taking and call 911.         Call [facility] at [phone] if any bleeding, swelling or drainage at cath site.  May shower, no tub baths for 48 hours for groin sticks. No lifting over 5 pounds for 5 days.  No Driving for 3 days.  Or Dr. Bethanne Ginger office  Heart Healthy diabetic diet.   Resume exercise slowly, no weights for at least a week.   We added toprol XL 25 mg once a day, and zetia. Continue Repatha   Do not stop ASA 81 mg daily and plavix 75 mg daily, stopping could cause a heart attack, you need for at least 1 year   Discharge Instructions    Amb Referral to Cardiac Rehabilitation   Complete by: As directed    Diagnosis:  Coronary Stents NSTEMI     After initial evaluation and assessments completed: Virtual Based Care may be provided alone or in conjunction with Phase 2 Cardiac Rehab based on patient barriers.:  Yes      Discharge Medications   Allergies as of 07/10/2019   No Known Allergies     Medication List    TAKE these medications   acetaminophen 325 MG tablet Commonly known as: TYLENOL Take 2 tablets (650 mg total) by mouth every 4 (four) hours as needed for headache or mild pain.   aspirin EC 81 MG tablet Take 1 tablet (81 mg total) by mouth daily.  What changed: when to take this   clopidogrel 75 MG tablet Commonly known as: PLAVIX Take 1 tablet (75 mg total) by mouth daily with breakfast. Start taking on: July 11, 2019   ezetimibe 10 MG tablet Commonly known as: ZETIA Take 1 tablet (10 mg total) by mouth daily. Start taking on: July 11, 2019   insulin aspart 100 UNIT/ML injection Commonly known as: novoLOG Inject 0-10 Units into the skin as needed. FOR HIGH BLOOD SUGAR  USUALLY 2 X MONTH   metoprolol succinate 25 MG 24 hr tablet Commonly known as: TOPROL-XL Take 1 tablet (25 mg total) by mouth daily. Start taking on: July 11, 2019   nitroGLYCERIN 0.4 MG SL tablet Commonly known as: NITROSTAT Place 1 tablet (0.4 mg total) under the tongue every 5 (five) minutes as needed for chest pain.   Toujeo Max SoloStar 300 UNIT/ML Sopn Generic drug: Insulin Glargine Inject 10 Units into the skin 2 (two) times daily.   Vascepa 1 g Caps Generic drug: Icosapent Ethyl Take 2 g by mouth every morning.          Outstanding Labs/Studies   H&L in 6 weeks  Duration of Discharge Encounter   Greater than 30 minutes including physician time.  Signed, Cecilie Kicks, NP 07/10/2019, 10:56 AM

## 2019-07-10 NOTE — Progress Notes (Addendum)
Progress Note  Patient Name: Chris Norris Date of Encounter: 07/10/2019  Primary Cardiologist: Yolonda Kida, MD   Subjective   Feels sore in his chest but no pain and no SOB.    Inpatient Medications    Scheduled Meds: . aspirin EC  81 mg Oral BID  . clopidogrel  75 mg Oral Q breakfast  . enoxaparin (LOVENOX) injection  40 mg Subcutaneous Q24H  . Icosapent Ethyl  2 g Oral Daily  . insulin glargine  10 Units Subcutaneous BID  .  morphine injection  2 mg Intravenous Once  . sodium chloride flush  3 mL Intravenous Q12H   Continuous Infusions: . sodium chloride     PRN Meds: sodium chloride, acetaminophen, ondansetron (ZOFRAN) IV, sodium chloride flush   Vital Signs    Vitals:   07/09/19 1644 07/09/19 1823 07/09/19 2100 07/10/19 0628  BP: 128/75 130/84 126/78 123/88  Pulse:   70 71  Resp:   18 18  Temp:   97.8 F (36.6 C) (!) 97.5 F (36.4 C)  TempSrc:   Oral Oral  SpO2: 96% 97% 96% 97%  Weight:    92 kg    Intake/Output Summary (Last 24 hours) at 07/10/2019 0818 Last data filed at 07/10/2019 0755 Gross per 24 hour  Intake 240 ml  Output 350 ml  Net -110 ml   Last 3 Weights 07/10/2019 07/09/2019 07/08/2019  Weight (lbs) 202 lb 13.2 oz 204 lb 1.6 oz 206 lb 11.2 oz  Weight (kg) 92 kg 92.579 kg 93.759 kg      Telemetry    SR - Personally Reviewed  ECG    SR with peaked T wave in V2 increased from 07/07/19 - Personally Reviewed  Physical Exam   GEN: No acute distress.   Neck: No JVD Cardiac: RRR, no murmurs, rubs, or gallops. Rt wrist stable no hematoma Respiratory: Clear to auscultation bilaterally. GI: Soft, nontender, non-distended  MS: No edema; No deformity. Neuro:  Nonfocal  Psych: Normal affect   Labs    High Sensitivity Troponin:   Recent Labs  Lab 07/07/19 0003 07/07/19 0155 07/07/19 0641  TROPONINIHS 18* 50* 60*      Chemistry Recent Labs  Lab 07/07/19 0003 07/08/19 0524 07/10/19 0353  NA 137 138 135  K 4.3 4.2  4.0  CL 108 109 105  CO2 23 23 17*  GLUCOSE 217* 137* 95  BUN 21* 17 19  CREATININE 0.97 0.83 1.05  CALCIUM 8.9 8.4* 8.7*  GFRNONAA >60 >60 >60  GFRAA >60 >60 >60  ANIONGAP 6 6 13      Hematology Recent Labs  Lab 07/08/19 0524 07/09/19 0528 07/10/19 0353  WBC 9.6 11.2* 9.9  RBC 6.23* 6.37* 6.50*  HGB 18.2* 18.1* 19.2*  HCT 53.9* 54.8* 56.1*  MCV 86.5 86.0 86.3  MCH 29.2 28.4 29.5  MCHC 33.8 33.0 34.2  RDW 13.2 13.2 13.6  PLT 256 283 253    BNPNo results for input(s): BNP, PROBNP in the last 168 hours.   DDimer No results for input(s): DDIMER in the last 168 hours.   Radiology    No results found.  Cardiac Studies   Cardiac cath at [place] with Dr. Clayborn Bigness. PCI at [facility]   Prox RCA lesion is 20% stenosed.  RPAV lesion is 20% stenosed.  Dist RCA lesion is 95% stenosed.  Post intervention, there is a 0% residual stenosis.  A drug-eluting stent was successfully placed using a STENT RESOLUTE ONYX 2.5X18.  Prox RCA  to Mid RCA lesion is 90% stenosed.  Post intervention, there is a 0% residual stenosis.  A drug-eluting stent was successfully placed using a STENT RESOLUTE ONYX 3.5X38.  Prox LAD to Mid LAD lesion is 20% stenosed.  Mid LAD lesion is 85% stenosed.  2nd Diag lesion is 80% stenosed.  Post intervention, there is a 0% residual stenosis.  A drug-eluting stent was successfully placed using a STENT RESOLUTE ONYX 2.25X22.  Post intervention, there is a 0% residual stenosis.  Balloon angioplasty was performed using a BALLOON SAPPHIRE 2.0X12.  Mid LAD to Dist LAD lesion is 60% stenosed.   1.  Successful complex high risk PCI and drug-eluting stent placement to the distal RCA and proximal to mid RCA with 2 separate stents. 2.  Successful bifurcation PCI of the LAD/diagonal.  The stent was placed into the diagonal back to the LAD.  Balloon angioplasty was performed on the ostium of the LAD to open the stent struts but the rest of the diffuse 60%  disease was left to be treated medically given that the whole LAD appeared disease.  Recommendations: Dual antiplatelet therapy for at least 1 year and ideally indefinitely. Aggressive treatment of risk factors. The patient has history of intolerance to statins but he is on Repatha. Possible discharge home tomorrow. The patient's primary cardiologist is Dr. Ubaldo Glassing in [place].  Diagnostic Dominance: Right  Intervention    Echo 07/07/19  IMPRESSIONS    1. Left ventricular ejection fraction, by visual estimation, is 50 to 55%. The left ventricle has normal function. There is no left ventricular hypertrophy.  2. Global right ventricle has normal systolic function.The right ventricular size is normal. No increase in right ventricular wall thickness.  3. Left atrial size was normal.  4. Right atrial size was not assessed.  5. The mitral valve is normal in structure. No evidence of mitral valve regurgitation.  6. The tricuspid valve is normal in structure. Tricuspid valve regurgitation was not visualized by color flow Doppler.  7. The aortic valve is normal in structure. Aortic valve regurgitation was not visualized by color flow Doppler.  8. The pulmonic valve was grossly normal. Pulmonic valve regurgitation is not visualized by color flow Doppler.  9. Aortic root could not be assessed.  FINDINGS  Left Ventricle: Left ventricular ejection fraction, by visual estimation, is 50 to 55%. The left ventricle has normal function. There is no left ventricular hypertrophy.  Right Ventricle: The right ventricular size is normal. No increase in right ventricular wall thickness. Global RV systolic function is has normal systolic function.  Left Atrium: Left atrial size was normal in size.  Right Atrium: Right atrial size was not assessed  Pericardium: There is no evidence of pericardial effusion.  Mitral Valve: The mitral valve is normal in structure. MV peak gradient, 2.4 mmHg. No  evidence of mitral valve regurgitation.  Tricuspid Valve: The tricuspid valve is normal in structure. Tricuspid valve regurgitation was not visualized by color flow Doppler.  Aortic Valve: The aortic valve is normal in structure. Aortic valve regurgitation was not visualized by color flow Doppler. Aortic valve mean gradient measures 6.0 mmHg. Aortic valve peak gradient measures 11.3 mmHg. Aortic valve area, by VTI measures  1.90 cm.  Pulmonic Valve: The pulmonic valve was grossly normal. Pulmonic valve regurgitation is not visualized by color flow Doppler.  Aorta: Aortic root could not be assessed.  IAS/Shunts: No atrial level shunt detected by color flow Doppler.      Patient Profile  58 y.o. male with known CAD, DM, HTN, HLD admitted to [place] with chest pain similar to pain with prior stents.  Hx of stents X 3 last 10 years prior to this admit.  Assessment & Plan    1. Unstable angina - cath at [facility] with 90% stenosis of dRCA and 09% stenosis of mLAD,  Underwent Successful complex high risk PCI and drug-eluting stent placement to the distal RCA and proximal to mid RCA with 2 separate stents and Successful bifurcation PCI of the LAD/diagonal.  The stent was placed into the diagonal back to the LAD.  Balloon angioplasty was performed on the ostium of the LAD to open the stent struts but the rest of the diffuse 60% disease was left to be treated medically given that the whole LAD appeared disease.  DAPT for at least 1 year and perhaps indefinitely. -EKG today with peaked T wave in V2  -ASA plavix  -HS troponin pk of 60 - Cr today 1.05 K+ 4.0, Hgb 19.2  -d/c today   2. CAD with prior stents, not on BB but is on Repatha for lipids- was on low pressor at [place] will add torpol XL 25 here.   3.  DM-2 continue insulin  Hgb A1c of 8.1, glucose is stable.  Resume home insulin at discharge.   4.  HLD on, Vascepa,  Repatha continue  LDL 65, Tchol 133, TG 206, HDL 27  5.   HTN controlled 123/88   For questions or updates, please contact [place] Please consult [email or web address] for contact info under      Signed, Cecilie Kicks, NP  07/10/2019, 8:18 AM     Patient seen and examined. Agree with assessment and plan.  I reviewed the angiograms from the diagnostic study from Dr. Jerrye Beavers as well as the PCI procedure from Dr. Fletcher Anon.  Patient feels well status post complex intervention to his RCA proximal to mid and distally as well as intervention to his abdomen PTCA through the stent struts to his LAD.  Even though the patient is on Repatha LDL continues to be elevated at 65.  Apparently he did not tolerate statin in the past.  Particularly with his development of progressive disease with an LDL of 65 I have recommended the addition of Zetia 10 mg in an attempt to get his LDL at least to the 40s or below.  He has agreed to initiate therapy.  We will discharge him today.  He will follow-up with Dr. Ubaldo Glassing in [place].   Troy Sine, MD, [facility] 07/10/2019 10:23 AM

## 2019-07-10 NOTE — Progress Notes (Signed)
CARDIAC REHAB PHASE I   PRE:  Rate/Rhythm: 78 SR  BP:  Supine:   Sitting: 128/83  Standing:    SaO2: 94%RA  MODE:  Ambulation: 650 ft   POST:  Rate/Rhythm: 85 SR  BP:  Supine:   Sitting: 127/90  Standing:    SaO2: 96%RA 0820-0920 Pt walked 650 ft on RA with steady gait and no CP. Tolerated well. Educated pt re MI as he stated he was positive. Discussed MI restrictions, NTG use, watching carbs and heart healthy food choices, ex ed and CRP 2. Pt referred to [facility] program but pt does not want to attend. Has been going to same gym for 40 years and is happy there. Encouraged no weight lifting or strenuous ex until seen by cardiologist as outpt.  Walking instructions given for ex now. Stressed importance of plavix with stents. Pt voiced understanding of all ed.   Graylon Good, RN BSN  07/10/2019 9:14 AM

## 2019-07-10 NOTE — Discharge Instructions (Signed)
Take 1 NTG, under your tongue, while sitting.  If no relief of pain may repeat NTG, one tab every 5 minutes up to 3 tablets total over 15 minutes.  If no relief CALL 911.  If you have dizziness/lightheadness  while taking NTG, stop taking and call 911.         Call [facility] at [phone] if any bleeding, swelling or drainage at cath site.  May shower, no tub baths for 48 hours for groin sticks. No lifting over 5 pounds for 5 days.  No Driving for 3 days.  Or Dr. Bethanne Ginger office  Heart Healthy diabetic diet.   Resume exercise slowly, no weights for at least a week.   We added toprol XL 25 mg once a day, and zetia. Continue Repatha   Do not stop ASA 81 mg daily and plavix 75 mg daily, stopping could cause a heart attack, you need for at least 1 year

## 2019-08-31 ENCOUNTER — Other Ambulatory Visit: Payer: Self-pay | Admitting: Cardiology

## 2019-12-28 IMAGING — CR DG CHEST 1V
1 series · 1 of 1 positions shown · non-contrast
Comparison: Radiograph 01/18/2019, CT 01/03/2013

CLINICAL DATA: Mid chest pain for 1 hour, history of prior MI

EXAM:
CHEST  1 VIEW

[dg chest 2 view]
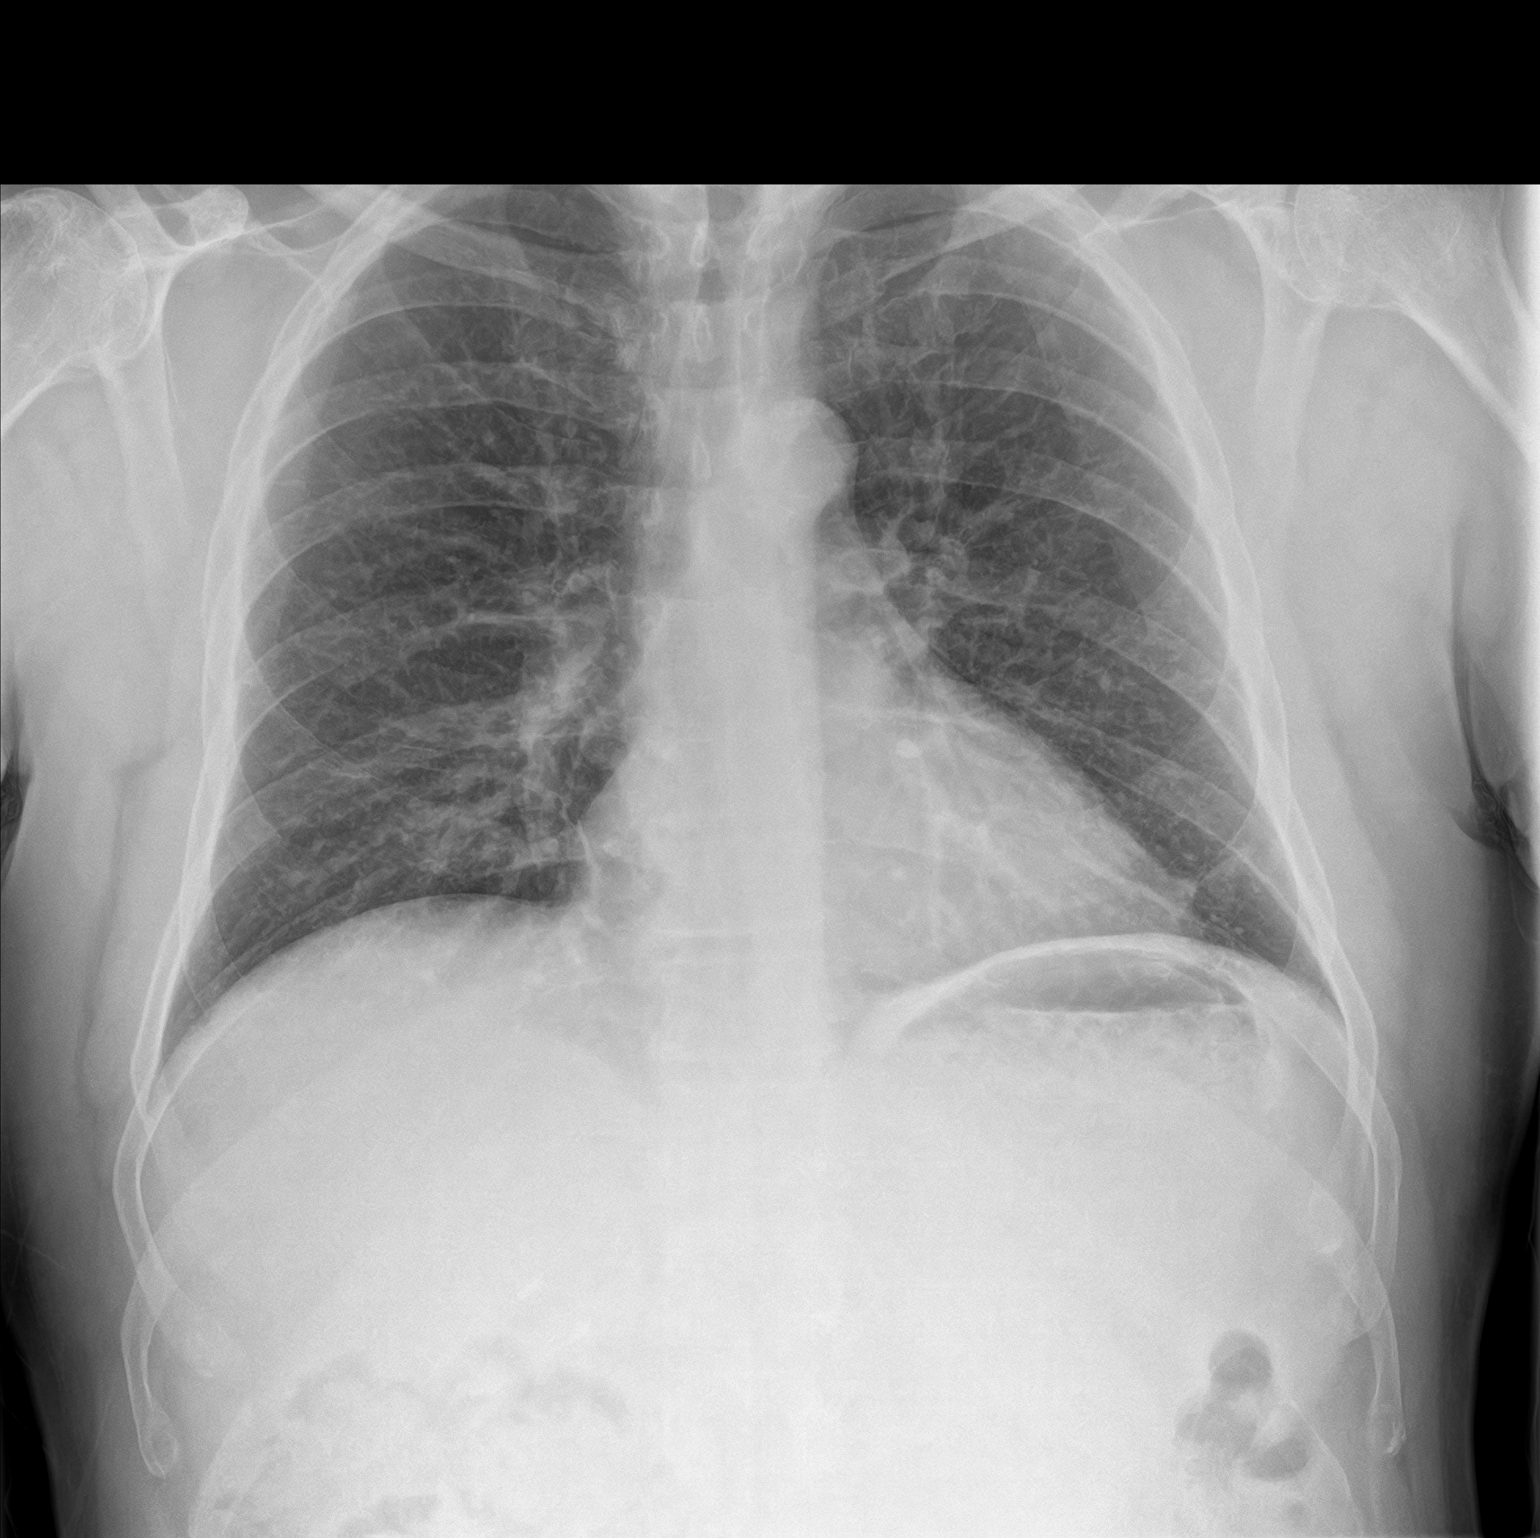

[1 of 1 positions shown; findings below may reference images not displayed]

FINDINGS: Chronic interstitial changes similar to priors. No consolidation,
features of edema, pneumothorax, or effusion. Pulmonary vascularity
is normally distributed. The cardiomediastinal contours are
unremarkable. No acute osseous or soft tissue abnormality.
IMPRESSION: Chronic interstitial changes.  No acute cardiopulmonary abnormality.

## 2019-12-29 ENCOUNTER — Other Ambulatory Visit: Payer: Self-pay

## 2019-12-29 ENCOUNTER — Ambulatory Visit: Payer: BC Managed Care – PPO | Attending: Internal Medicine

## 2019-12-29 DIAGNOSIS — Z23 Encounter for immunization: Secondary | ICD-10-CM

## 2019-12-29 NOTE — Progress Notes (Signed)
   Covid-19 Vaccination Clinic  Name:  KEISHON SCHIFFNER    MRN: [id number] DOB: [birth year]  12/29/2019  Mr. Portera was observed post Covid-19 immunization for 15 minutes without incident. He was provided with Vaccine Information Sheet and instruction to access the V-Safe system.   Mr. Bernhard was instructed to call 911 with any severe reactions post vaccine: Marland Kitchen Difficulty breathing  . Swelling of face and throat  . A fast heartbeat  . A bad rash all over body  . Dizziness and weakness   Immunizations Administered    Name Date Dose VIS Date Route   Pfizer COVID-19 Vaccine 12/29/2019  2:08 PM 0.3 mL 09/05/2019 Intramuscular   Manufacturer: [place]   Lot: [id number]   [place]: [id number]

## 2020-01-21 ENCOUNTER — Ambulatory Visit: Payer: BC Managed Care – PPO | Attending: Internal Medicine

## 2020-01-21 DIAGNOSIS — Z23 Encounter for immunization: Secondary | ICD-10-CM

## 2020-01-21 NOTE — Progress Notes (Signed)
   Covid-19 Vaccination Clinic  Name:  Chris Norris    MRN: [id number] DOB: [birth year]  01/21/2020  Mr. Chris Norris was observed post Covid-19 immunization for 15 minutes without incident. He was provided with Vaccine Information Sheet and instruction to access the V-Safe system.   Mr. Chris Norris was instructed to call 911 with any severe reactions post vaccine: Marland Kitchen Difficulty breathing  . Swelling of face and throat  . A fast heartbeat  . A bad rash all over body  . Dizziness and weakness   Immunizations Administered    Name Date Dose VIS Date Route   Pfizer COVID-19 Vaccine 01/21/2020  1:34 PM 0.3 mL 11/19/2018 Intramuscular   Manufacturer: [place]   Lot: [id number]   [place]: [id number]

## 2020-07-28 ENCOUNTER — Ambulatory Visit: Payer: BC Managed Care – PPO | Admitting: Dermatology

## 2020-07-30 ENCOUNTER — Other Ambulatory Visit (HOSPITAL_COMMUNITY): Payer: Self-pay | Admitting: Orthopedic Surgery

## 2020-07-30 ENCOUNTER — Other Ambulatory Visit: Payer: Self-pay | Admitting: Orthopedic Surgery

## 2020-07-30 DIAGNOSIS — Z20818 Contact with and (suspected) exposure to other bacterial communicable diseases: Secondary | ICD-10-CM

## 2020-08-10 ENCOUNTER — Telehealth: Payer: Self-pay | Admitting: Oncology

## 2020-08-10 NOTE — Telephone Encounter (Signed)
08/10/2020 Called and left VM for pt informing him that a referral was put in for him to see Dr. Janese Banks again; last visit in 2019, for concerns about polycythemia. Appt made per MD for 11/22 @ 10:45. Left callback number and asked pt to call if he had any questions or conflicts with this day/time  SRW

## 2020-08-16 ENCOUNTER — Inpatient Hospital Stay: Payer: BC Managed Care – PPO

## 2020-08-16 ENCOUNTER — Inpatient Hospital Stay: Payer: BC Managed Care – PPO | Attending: Oncology | Admitting: Oncology

## 2020-08-16 ENCOUNTER — Other Ambulatory Visit: Payer: Self-pay

## 2020-08-16 ENCOUNTER — Telehealth: Payer: Self-pay | Admitting: *Deleted

## 2020-08-16 ENCOUNTER — Encounter: Payer: Self-pay | Admitting: Oncology

## 2020-08-16 VITALS — BP 127/83 | HR 83 | Temp 98.1°F | Wt 210.0 lb

## 2020-08-16 DIAGNOSIS — G4730 Sleep apnea, unspecified: Secondary | ICD-10-CM | POA: Insufficient documentation

## 2020-08-16 DIAGNOSIS — R5381 Other malaise: Secondary | ICD-10-CM | POA: Diagnosis not present

## 2020-08-16 DIAGNOSIS — I2510 Atherosclerotic heart disease of native coronary artery without angina pectoris: Secondary | ICD-10-CM | POA: Insufficient documentation

## 2020-08-16 DIAGNOSIS — Z955 Presence of coronary angioplasty implant and graft: Secondary | ICD-10-CM | POA: Insufficient documentation

## 2020-08-16 DIAGNOSIS — T387X5A Adverse effect of androgens and anabolic congeners, initial encounter: Secondary | ICD-10-CM | POA: Insufficient documentation

## 2020-08-16 DIAGNOSIS — R5383 Other fatigue: Secondary | ICD-10-CM | POA: Insufficient documentation

## 2020-08-16 DIAGNOSIS — E119 Type 2 diabetes mellitus without complications: Secondary | ICD-10-CM | POA: Insufficient documentation

## 2020-08-16 DIAGNOSIS — Z7982 Long term (current) use of aspirin: Secondary | ICD-10-CM | POA: Diagnosis not present

## 2020-08-16 DIAGNOSIS — D751 Secondary polycythemia: Secondary | ICD-10-CM

## 2020-08-16 DIAGNOSIS — I252 Old myocardial infarction: Secondary | ICD-10-CM | POA: Diagnosis not present

## 2020-08-16 DIAGNOSIS — Z79899 Other long term (current) drug therapy: Secondary | ICD-10-CM | POA: Insufficient documentation

## 2020-08-16 LAB — CBC WITH DIFFERENTIAL/PLATELET
Abs Immature Granulocytes: 0.04 10*3/uL (ref 0.00–0.07)
Basophils Absolute: 0.1 10*3/uL (ref 0.0–0.1)
Basophils Relative: 1 %
Eosinophils Absolute: 0.2 10*3/uL (ref 0.0–0.5)
Eosinophils Relative: 3 %
HCT: 59 % — ABNORMAL HIGH (ref 39.0–52.0)
Hemoglobin: 20.4 g/dL — ABNORMAL HIGH (ref 13.0–17.0)
Immature Granulocytes: 1 %
Lymphocytes Relative: 27 %
Lymphs Abs: 1.9 10*3/uL (ref 0.7–4.0)
MCH: 30.3 pg (ref 26.0–34.0)
MCHC: 34.6 g/dL (ref 30.0–36.0)
MCV: 87.7 fL (ref 80.0–100.0)
Monocytes Absolute: 0.6 10*3/uL (ref 0.1–1.0)
Monocytes Relative: 9 %
Neutro Abs: 4.3 10*3/uL (ref 1.7–7.7)
Neutrophils Relative %: 59 %
Platelets: 263 10*3/uL (ref 150–400)
RBC: 6.73 MIL/uL — ABNORMAL HIGH (ref 4.22–5.81)
RDW: 14.6 % (ref 11.5–15.5)
WBC: 7.2 10*3/uL (ref 4.0–10.5)
nRBC: 0 % (ref 0.0–0.2)

## 2020-08-16 LAB — COMPREHENSIVE METABOLIC PANEL
ALT: 51 U/L — ABNORMAL HIGH (ref 0–44)
AST: 39 U/L (ref 15–41)
Albumin: 3.9 g/dL (ref 3.5–5.0)
Alkaline Phosphatase: 56 U/L (ref 38–126)
Anion gap: 12 (ref 5–15)
BUN: 20 mg/dL (ref 6–20)
CO2: 20 mmol/L — ABNORMAL LOW (ref 22–32)
Calcium: 8.9 mg/dL (ref 8.9–10.3)
Chloride: 104 mmol/L (ref 98–111)
Creatinine, Ser: 0.94 mg/dL (ref 0.61–1.24)
GFR, Estimated: >60 mL/min (ref >60)
Glucose, Bld: 205 mg/dL — ABNORMAL HIGH (ref 70–99)
Potassium: 4.3 mmol/L (ref 3.5–5.1)
Sodium: 136 mmol/L (ref 135–145)
Total Bilirubin: 0.6 mg/dL (ref 0.3–1.2)
Total Protein: 7.2 g/dL (ref 6.5–8.1)

## 2020-08-16 NOTE — Telephone Encounter (Signed)
Called and got voicemail. hgb 20.4 and he will need to be scheduled for phlebotomy. I might can work him tom around 2:30. Other wise the rest of week has no openings due to holidays and the week after that there is no openings. Left my direct number to call me back and see what we can work out.

## 2020-08-16 NOTE — Progress Notes (Signed)
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 Erlene Quan back in 2008 for polycythemia. I do not have those records at this time. He has been referred to [place] for polycythemia. CBC from 12/14/2016 showed white count of 10.9, H&H of 19.8/excision on with a platelet count of 207. CMP was significant for a blood sugar of 188. TSH was within normal limits. Total testosterone was significantly elevated at 1176.Patient noted to have a hemoglobin of 15-16 between 2014 in 2015 and was found to have a hemoglobin of 19.9 on 02/15/2017  Patient states that he has possible hypogonadism (although not formally diagnosed)and report significant fatigue when he is not on testosterone. He has never seen a urologist for the same. he has been injecting himself testosterone shots mainly for his fatigue and building muscle. Denies any headaches or lightheadedness chest pain or strokelike symptoms. He has had coronary disease in the past and has had stent placements. He has smoked occasional cigars in the past. No known chronic lung disease  Further workup on 02/15/2017 revealed no evidence of jak 2and exon 12 mutation. Urinalysis is negative for hematuria. Chest x-ray showed coarse interstitial markings. No acute disease  Patient went through phlebotomy in July 2019. He did not follow up after that  Interval history-patient states that he had another cardiac event  in the last 2 years and has undergone cardiac stenting.  He denies any use of exogenous testosterone use or testosterone replacement therapy.  He reports taking over-the-counter Neugenix which is a testosterone boosting supplement.  He is continuing to work out at [employer].  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  During that time he was found to have a hemoglobin of 21 and therefore referred to [place]  ECOG PS- 0 Pain scale- 0   Review of systems- Review of Systems  Constitutional: Positive for malaise/fatigue. Negative for chills, fever and weight loss.  HENT: Negative for congestion, ear discharge and nosebleeds.   Eyes: Negative for blurred vision.  Respiratory: Negative for cough, hemoptysis, sputum production, shortness of breath and wheezing.   Cardiovascular: Negative for chest pain, palpitations, orthopnea and claudication.  Gastrointestinal: Negative for abdominal pain, blood in stool, constipation, diarrhea, heartburn, melena, nausea and vomiting.  Genitourinary: Negative for dysuria, flank pain, frequency, hematuria and urgency.  Musculoskeletal: Positive for neck pain. Negative for back pain, joint pain and myalgias.  Skin: Negative for rash.  Neurological: Negative for dizziness, tingling, focal weakness, seizures, weakness and headaches.  Endo/Heme/Allergies: Does not bruise/bleed easily.  Psychiatric/Behavioral: Negative for depression and suicidal ideas. The patient does not have insomnia.       No Known Allergies   Past Medical History:  Diagnosis Date  . Coronary artery disease   . Diabetes mellitus without complication ([place])   . Heart disease   . Myocardial infarction ([place])    X 3  STENTS LAST 10 YEARS AGO  . Sleep apnea in adult   . Type 2 diabetes mellitus without complication, with long-term current use of insulin ([place])  07/10/2019     Past Surgical History:  Procedure Laterality Date  . BICEPS TENDON REPAIR    . BICEPS TENDON REPAIR Right 11/28/2018   Procedure: OPEN SUBPECTORAL REPAIR OF THE LONG HEAD OF BICEP TENDON-RIGHT SHOULDER;  Surgeon: Corky Mull, MD;  Location: [facility] ORS;  Service: Orthopedics;  Laterality: Right;  . CORONARY ANGIOPLASTY     X 3  . CORONARY STENT INTERVENTION N/A 07/09/2019   Procedure: CORONARY STENT INTERVENTION;  Surgeon: Wellington Hampshire, MD;  Location: [place] [facility];  Service: Cardiovascular;  Laterality: N/A;  Distal RCA, Prox RCA, 1st Diag  . KNEE ARTHROSCOPY W/ MENISCAL REPAIR    . LEFT HEART CATH AND CORONARY ANGIOGRAPHY N/A 07/07/2019   Procedure: LEFT HEART CATH AND CORONARY ANGIOGRAPHY with cors and possible pci and stent;  Surgeon: Yolonda Kida, MD;  Location: [place] [facility];  Service: Cardiovascular;  Laterality: N/A;  . STENT PLACEMENT VASCULAR ([place] HX)     X 3 LAST 10 YEARS AGO  . TRICEPS TENDON REPAIR      Social History   Socioeconomic History  . Marital status: Single    Spouse name: Not on file  . Number of children: Not on file  . Years of education: Not on file  . Highest education level: Not on file  Occupational History  . Not on file  Tobacco Use  . Smoking status: Never Smoker  . Smokeless tobacco: Never Used  . Tobacco comment: occasional cigars  Vaping Use  . Vaping Use: Never used  Substance and Sexual Activity  . Alcohol use: Not Currently  . Drug use: Never  . Sexual activity: Yes    Birth control/protection: None  Other Topics Concern  . Not on file  Social History Narrative  . Not on file   Social Determinants of Health   Financial Resource Strain:   . Difficulty of Paying Living Expenses: Not on file  Food Insecurity:   . Worried About Charity fundraiser in the Last Year: Not on file  . Ran Out of Food in the Last Year: Not on file  Transportation Needs:   . Lack of Transportation (Medical): Not on  file  . Lack of Transportation (Non-Medical): Not on file  Physical Activity:   . Days of Exercise per Week: Not on file  . Minutes of Exercise per Session: Not on file  Stress:   . Feeling of Stress : Not on file  Social Connections:   . Frequency of Communication with Friends and Family: Not on file  . Frequency of Social Gatherings with Friends and Family: Not on file  . Attends Religious Services: Not on file  . Active Member of Clubs or Organizations: Not on file  . Attends Archivist Meetings: Not on file  . Marital Status: Not on file  Intimate Partner Violence:   . Fear of Current or Ex-Partner: Not on file  . Emotionally Abused: Not on file  . Physically Abused: Not on file  . Sexually Abused: Not on file    No family history on file.   Current Outpatient Medications:  .  acetaminophen (TYLENOL) 325 MG tablet, Take 2 tablets (650 mg total) by mouth every 4 (four) hours as needed for headache or mild pain., Disp:  , Rfl:  .  aspirin  EC 81 MG tablet, Take 1 tablet (81 mg total) by mouth daily., Disp: , Rfl:  .  clopidogrel (PLAVIX) 75 MG tablet, TAKE 1 TABLET (75 MG TOTAL) BY MOUTH DAILY WITH BREAKFAST., Disp: 90 tablet, Rfl: 0 .  ezetimibe (ZETIA) 10 MG tablet, TAKE 1 TABLET BY MOUTH EVERY DAY, Disp: 90 tablet, Rfl: 0 .  Icosapent Ethyl (VASCEPA) 1 g CAPS, Take 2 g by mouth every morning., Disp: , Rfl:  .  insulin aspart (NOVOLOG) 100 UNIT/ML injection, Inject 0-10 Units into the skin as needed. FOR HIGH BLOOD SUGAR  USUALLY 2 X MONTH, Disp: , Rfl:  .  Insulin Glargine (TOUJEO MAX SOLOSTAR) 300 UNIT/ML SOPN, Inject 10 Units into the skin 2 (two) times daily., Disp: , Rfl:  .  metoprolol succinate (TOPROL-XL) 25 MG 24 hr tablet, TAKE 1 TABLET BY MOUTH EVERY DAY, Disp: 90 tablet, Rfl: 0 .  nitroGLYCERIN (NITROSTAT) 0.4 MG SL tablet, Place 1 tablet (0.4 mg total) under the tongue every 5 (five) minutes as needed for chest pain., Disp: 25 tablet, Rfl: 4  Physical  exam:  Vitals:   08/16/20 1102  BP: 127/83  Pulse: 83  Temp: 98.1 F (36.7 C)  TempSrc: Tympanic  SpO2: 100%  Weight: 210 lb (95.3 kg)   Physical Exam Constitutional:      General: He is not in acute distress.    Comments: Face appears plethoric.  Patient has a muscular built  Cardiovascular:     Rate and Rhythm: Normal rate and regular rhythm.     Heart sounds: Normal heart sounds.  Pulmonary:     Effort: Pulmonary effort is normal.     Breath sounds: Normal breath sounds.  Abdominal:     General: Bowel sounds are normal.     Palpations: Abdomen is soft.     Comments: No palpable splenomegaly  Skin:    General: Skin is warm and dry.  Neurological:     Mental Status: He is alert and oriented to person, place, and time.      CMP Latest Ref Rng & Units 08/16/2020  Glucose 70 - 99 mg/dL 205(H)  BUN 6 - 20 mg/dL 20  Creatinine 0.61 - 1.24 mg/dL 0.94  Sodium 135 - 145 mmol/L 136  Potassium 3.5 - 5.1 mmol/L 4.3  Chloride 98 - 111 mmol/L 104  CO2 22 - 32 mmol/L 20(L)  Calcium 8.9 - 10.3 mg/dL 8.9  Total Protein 6.5 - 8.1 g/dL 7.2  Total Bilirubin 0.3 - 1.2 mg/dL 0.6  Alkaline Phos 38 - 126 U/L 56  AST 15 - 41 U/L 39  ALT 0 - 44 U/L 51(H)   CBC Latest Ref Rng & Units 08/16/2020  WBC 4.0 - 10.5 K/uL 7.2  Hemoglobin 13.0 - 17.0 g/dL 20.4(H)  Hematocrit 39 - 52 % 59.0(H)  Platelets 150 - 400 K/uL 263      Assessment and plan- Patient is a 59 y.o. male seen in the past for secondary polycythemia from testosterone replacement therapy and was lost to follow-up here for routine follow-up  When patient was seen by me back in June 2019 his hemoglobin had dropped down to 16 and that point he had mentioned that he had completely stopped his testosterone replacement therapy/exogenous testosterone for about 2 months prior to that.  Patient's hemoglobin has been steadily going up after April 2020 and over the last 1 year it has been fluctuating between 18-19.  Patient states that  he is not using any testosterone presently  but uses an over-the-counter neugenix supplement which is a testosterone booster.  When I looked at the ingredients of this medication he does not overtly state that it has any testosterone but does have some herbal elements.  I have asked the patient to stop taking the supplement altogether.  I will repeat a CBC with differential EPO level, testosterone levels and CMP today.  I will see him back in 2 weeks for an in person visit and possible phlebotomy.  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 Polycythemia, secondary      Dr. Randa Evens, MD, MPH [facility] [phone] 08/16/2020 5:06 PM

## 2020-08-17 LAB — TESTOSTERONE: Testosterone: 366 ng/dL (ref 264–916)

## 2020-08-17 LAB — ERYTHROPOIETIN: Erythropoietin: 11.1 m[IU]/mL (ref 2.6–18.5)

## 2020-08-30 ENCOUNTER — Other Ambulatory Visit: Payer: Self-pay | Admitting: *Deleted

## 2020-08-30 DIAGNOSIS — D751 Secondary polycythemia: Secondary | ICD-10-CM

## 2020-08-31 ENCOUNTER — Encounter: Payer: Self-pay | Admitting: Oncology

## 2020-08-31 ENCOUNTER — Inpatient Hospital Stay: Payer: BC Managed Care – PPO | Attending: Oncology | Admitting: Oncology

## 2020-08-31 ENCOUNTER — Inpatient Hospital Stay: Payer: BC Managed Care – PPO

## 2020-08-31 ENCOUNTER — Other Ambulatory Visit: Payer: Self-pay | Admitting: *Deleted

## 2020-08-31 ENCOUNTER — Encounter: Payer: Self-pay | Admitting: *Deleted

## 2020-08-31 ENCOUNTER — Other Ambulatory Visit: Payer: Self-pay

## 2020-08-31 DIAGNOSIS — R5383 Other fatigue: Secondary | ICD-10-CM | POA: Insufficient documentation

## 2020-08-31 DIAGNOSIS — D751 Secondary polycythemia: Secondary | ICD-10-CM | POA: Diagnosis not present

## 2020-08-31 DIAGNOSIS — Z794 Long term (current) use of insulin: Secondary | ICD-10-CM | POA: Diagnosis not present

## 2020-08-31 DIAGNOSIS — Z79899 Other long term (current) drug therapy: Secondary | ICD-10-CM | POA: Insufficient documentation

## 2020-08-31 DIAGNOSIS — M542 Cervicalgia: Secondary | ICD-10-CM | POA: Insufficient documentation

## 2020-08-31 DIAGNOSIS — I2510 Atherosclerotic heart disease of native coronary artery without angina pectoris: Secondary | ICD-10-CM | POA: Insufficient documentation

## 2020-08-31 DIAGNOSIS — Z7982 Long term (current) use of aspirin: Secondary | ICD-10-CM | POA: Insufficient documentation

## 2020-08-31 DIAGNOSIS — I252 Old myocardial infarction: Secondary | ICD-10-CM | POA: Insufficient documentation

## 2020-08-31 DIAGNOSIS — E119 Type 2 diabetes mellitus without complications: Secondary | ICD-10-CM | POA: Insufficient documentation

## 2020-08-31 LAB — CBC WITH DIFFERENTIAL/PLATELET
Abs Immature Granulocytes: 0.03 10*3/uL (ref 0.00–0.07)
Basophils Absolute: 0.1 10*3/uL (ref 0.0–0.1)
Basophils Relative: 1 %
Eosinophils Absolute: 0.2 10*3/uL (ref 0.0–0.5)
Eosinophils Relative: 3 %
HCT: 58.9 % — ABNORMAL HIGH (ref 39.0–52.0)
Hemoglobin: 20.5 g/dL — ABNORMAL HIGH (ref 13.0–17.0)
Immature Granulocytes: 0 %
Lymphocytes Relative: 27 %
Lymphs Abs: 2.3 10*3/uL (ref 0.7–4.0)
MCH: 30.5 pg (ref 26.0–34.0)
MCHC: 34.8 g/dL (ref 30.0–36.0)
MCV: 87.6 fL (ref 80.0–100.0)
Monocytes Absolute: 0.8 10*3/uL (ref 0.1–1.0)
Monocytes Relative: 10 %
Neutro Abs: 5.1 10*3/uL (ref 1.7–7.7)
Neutrophils Relative %: 59 %
Platelets: 236 10*3/uL (ref 150–400)
RBC: 6.72 MIL/uL — ABNORMAL HIGH (ref 4.22–5.81)
RDW: 14.4 % (ref 11.5–15.5)
WBC: 8.5 10*3/uL (ref 4.0–10.5)
nRBC: 0 % (ref 0.0–0.2)

## 2020-08-31 NOTE — Progress Notes (Signed)
353ml therapeutic phlebotomy completed per orders, well tolerated. VSS at discharge.

## 2020-08-31 NOTE — Progress Notes (Signed)
Pt has no concerns today wanting to know if he needs phlebotomy.

## 2020-09-03 ENCOUNTER — Inpatient Hospital Stay: Payer: BC Managed Care – PPO

## 2020-09-03 DIAGNOSIS — D751 Secondary polycythemia: Secondary | ICD-10-CM

## 2020-09-03 NOTE — Progress Notes (Signed)
Hematology/Oncology Consult note [facility]  [phone] [phone]  Patient Care Team: Remi Haggard, FNP as PCP - General (Family Medicine) Teodoro Spray, MD as PCP - Cardiology (Cardiology)   Name of the patient: Chris Norris  [id number]  1961-04-16   Date of visit: 09/03/20  Diagnosis-secondary polycythemia of unclear etiology  Chief complaint/ Reason for visit-discuss results of blood work  Heme/Onc history: patient is a 59 year old male was then seen by Dr. Erlene Quan back in 2008 for polycythemia. I do not have those records at this time. He has been referred to [place] for polycythemia. CBC from 12/14/2016 showed white count of 10.9, H&H of 19.8/excision on with a platelet count of 207. CMP was significant for a blood sugar of 188. TSH was within normal limits. Total testosterone was significantly elevated at 1176.Patient noted to have a hemoglobin of 15-16 between 2014 in 2015 and was found to have a hemoglobin of 19.9 on 02/15/2017  Patient states that he has possible hypogonadism (although not formally diagnosed)and report significant fatigue when he is not on testosterone. He has never seen a urologist for the same. he has been injecting himself testosterone shots mainly for his fatigue and building muscle. Denies any headaches or lightheadedness chest pain or strokelike symptoms. He has had coronary disease in the past and has had stent placements. He has smoked occasional cigars in the past. No known chronic lung disease  Further workup on 02/15/2017 revealed no evidence of jak 2and exon 12 mutation. Urinalysis is negative for hematuria. Chest x-ray showed coarse interstitial markings. No acute disease  Patient went through phlebotomy in July 2019. He did not follow up after that  Interval history-patient has ongoing neck pain and will be going through neck surgery for discitis in 2 weeks.  ECOG PS- 0 Pain scale-  2 Opioid associated constipation- no  Review of systems- Review of Systems  Constitutional: Negative for chills, fever, malaise/fatigue and weight loss.  HENT: Negative for congestion, ear discharge and nosebleeds.   Eyes: Negative for blurred vision.  Respiratory: Negative for cough, hemoptysis, sputum production, shortness of breath and wheezing.   Cardiovascular: Negative for chest pain, palpitations, orthopnea and claudication.  Gastrointestinal: Negative for abdominal pain, blood in stool, constipation, diarrhea, heartburn, melena, nausea and vomiting.  Genitourinary: Negative for dysuria, flank pain, frequency, hematuria and urgency.  Musculoskeletal: Positive for neck pain. Negative for back pain, joint pain and myalgias.  Skin: Negative for rash.  Neurological: Negative for dizziness, tingling, focal weakness, seizures, weakness and headaches.  Endo/Heme/Allergies: Does not bruise/bleed easily.  Psychiatric/Behavioral: Negative for depression and suicidal ideas. The patient does not have insomnia.       No Known Allergies   Past Medical History:  Diagnosis Date  . Coronary artery disease   . Diabetes mellitus without complication ([place])   . Heart disease   . Myocardial infarction ([facility])    X 3 STENTS LAST 10 YEARS AGO  . Sleep apnea in adult   . Type 2 diabetes mellitus without complication, with long-term current use of insulin ([place]) 07/10/2019     Past Surgical History:  Procedure Laterality Date  . BICEPS TENDON REPAIR    . BICEPS TENDON REPAIR Right 11/28/2018   Procedure: OPEN SUBPECTORAL REPAIR OF THE LONG HEAD OF BICEP TENDON-RIGHT SHOULDER;  Surgeon: Corky Mull, MD;  Location: [facility] ORS;  Service: Orthopedics;  Laterality: Right;  . CORONARY ANGIOPLASTY     X 3  . CORONARY  STENT INTERVENTION N/A 07/09/2019   Procedure: CORONARY STENT INTERVENTION;  Surgeon: Wellington Hampshire, MD;  Location: [place] [facility];  Service: Cardiovascular;  Laterality: N/A;  Distal  RCA, Prox RCA, 1st Diag  . KNEE ARTHROSCOPY W/ MENISCAL REPAIR    . LEFT HEART CATH AND CORONARY ANGIOGRAPHY N/A 07/07/2019   Procedure: LEFT HEART CATH AND CORONARY ANGIOGRAPHY with cors and possible pci and stent;  Surgeon: Yolonda Kida, MD;  Location: [place] [facility];  Service: Cardiovascular;  Laterality: N/A;  . STENT PLACEMENT VASCULAR ([place] HX)     X 3 LAST 10 YEARS AGO  . TRICEPS TENDON REPAIR      Social History   Socioeconomic History  . Marital status: Single    Spouse name: Not on file  . Number of children: Not on file  . Years of education: Not on file  . Highest education level: Not on file  Occupational History  . Not on file  Tobacco Use  . Smoking status: Never Smoker  . Smokeless tobacco: Never Used  . Tobacco comment: occasional cigars  Vaping Use  . Vaping Use: Never used  Substance and Sexual Activity  . Alcohol use: Not Currently  . Drug use: Never  . Sexual activity: Yes    Birth control/protection: None  Other Topics Concern  . Not on file  Social History Narrative  . Not on file   Social Determinants of Health   Financial Resource Strain: Not on file  Food Insecurity: Not on file  Transportation Needs: Not on file  Physical Activity: Not on file  Stress: Not on file  Social Connections: Not on file  Intimate Partner Violence: Not on file    No family history on file.   Current Outpatient Medications:  .  acetaminophen (TYLENOL) 325 MG tablet, Take 2 tablets (650 mg total) by mouth every 4 (four) hours as needed for headache or mild pain., Disp:  , Rfl:  .  aspirin EC 81 MG tablet, Take 1 tablet (81 mg total) by mouth daily., Disp: , Rfl:  .  clopidogrel (PLAVIX) 75 MG tablet, TAKE 1 TABLET (75 MG TOTAL) BY MOUTH DAILY WITH BREAKFAST., Disp: 90 tablet, Rfl: 0 .  diclofenac (VOLTAREN) 50 MG EC tablet, Take 50 mg by mouth daily., Disp: , Rfl:  .  Icosapent Ethyl (VASCEPA) 1 g CAPS, Take 2 g by mouth every morning., Disp: , Rfl:   .  Insulin Glargine (TOUJEO MAX SOLOSTAR) 300 UNIT/ML SOPN, Inject 10 Units into the skin 2 (two) times daily., Disp: , Rfl:  .  metoprolol succinate (TOPROL-XL) 25 MG 24 hr tablet, TAKE 1 TABLET BY MOUTH EVERY DAY, Disp: 90 tablet, Rfl: 0 .  nitroGLYCERIN (NITROSTAT) 0.4 MG SL tablet, Place 1 tablet (0.4 mg total) under the tongue every 5 (five) minutes as needed for chest pain., Disp: 25 tablet, Rfl: 4 .  ezetimibe (ZETIA) 10 MG tablet, TAKE 1 TABLET BY MOUTH EVERY DAY (Patient not taking: Reported on 08/31/2020), Disp: 90 tablet, Rfl: 0 .  insulin aspart (NOVOLOG) 100 UNIT/ML injection, Inject 0-10 Units into the skin as needed. FOR HIGH BLOOD SUGAR  USUALLY 2 X MONTH, Disp: , Rfl:   Physical exam:  Vitals:   08/31/20 1535  BP: 134/83  Pulse: 80  Resp: 16  Temp: 97.6 F (36.4 C)  TempSrc: Oral   Physical Exam Constitutional:      General: He is not in acute distress. Eyes:     Extraocular Movements:  EOM normal.  Pulmonary:     Effort: Pulmonary effort is normal.  Skin:    General: Skin is warm and dry.  Neurological:     Mental Status: He is alert and oriented to person, place, and time.      CMP Latest Ref Rng & Units 08/16/2020  Glucose 70 - 99 mg/dL 205(H)  BUN 6 - 20 mg/dL 20  Creatinine 0.61 - 1.24 mg/dL 0.94  Sodium 135 - 145 mmol/L 136  Potassium 3.5 - 5.1 mmol/L 4.3  Chloride 98 - 111 mmol/L 104  CO2 22 - 32 mmol/L 20(L)  Calcium 8.9 - 10.3 mg/dL 8.9  Total Protein 6.5 - 8.1 g/dL 7.2  Total Bilirubin 0.3 - 1.2 mg/dL 0.6  Alkaline Phos 38 - 126 U/L 56  AST 15 - 41 U/L 39  ALT 0 - 44 U/L 51(H)   CBC Latest Ref Rng & Units 08/31/2020  WBC 4.0 - 10.5 K/uL 8.5  Hemoglobin 13.0 - 17.0 g/dL 20.5(H)  Hematocrit 39.0 - 52.0 % 58.9(H)  Platelets 150 - 400 K/uL 236      Assessment and plan- Patient is a 59 y.o. male with prior history of testosterone induced polycythemia now referred for polycythemia  Patient presently states that he is not on any testosterone  supplements.  Testosterone levels I checked 2 weeks ago were normal at 366.  EPO levels were normal as well.  Patient's JAK2 and exon 12 mutation testing back in 2018 was negative but I will repeat those this time as well.  Patient takes a testosterone booster Neugenix and I have asked him to stop taking it to see if it would make a difference to his hemoglobin levels.  Regardless with an H&H of 20.5 and 58.9, I will proceed with phlebotomies twice a week starting today and hopefully he can get 4 sessions before he goes for his discitis surgery.  Patient will need a couple of weeks to recover after that before he can resume his phlebotomies.  Goal is to keep his hematocrit less than 50.  H&H rises fairly quickly after stopping phlebotomy I will get a bone marrow biopsy to rule out a myeloproliferative process despite a negative JAK2 and exon 12.  Patient verbalized understanding   Visit Diagnosis 1. Polycythemia, secondary      Dr. Randa Evens, MD, MPH [facility] [phone] 09/03/2020 8:39 AM

## 2020-09-03 NOTE — Progress Notes (Signed)
Pt tolerated 350 ml phlebotomy well. VSS. No complaints of any discomfort. Instructed pt to hydrate well for rest of day.

## 2020-09-07 LAB — JAK2  V617F QUAL. WITH REFLEX TO EXON 12: Reflex:: 15

## 2020-09-07 LAB — JAK2 EXONS 12-15

## 2020-09-08 ENCOUNTER — Inpatient Hospital Stay: Payer: BC Managed Care – PPO

## 2020-09-08 ENCOUNTER — Other Ambulatory Visit: Payer: BC Managed Care – PPO

## 2020-09-08 DIAGNOSIS — D751 Secondary polycythemia: Secondary | ICD-10-CM | POA: Diagnosis not present

## 2020-09-08 LAB — HEMOGLOBIN AND HEMATOCRIT, BLOOD
HCT: 52.1 % — ABNORMAL HIGH (ref 39.0–52.0)
Hemoglobin: 18.2 g/dL — ABNORMAL HIGH (ref 13.0–17.0)

## 2020-09-08 NOTE — Progress Notes (Signed)
Pt tolerated 350 ml phlebotomy today. VSS. No complaints at discharge.Ambulatory.

## 2020-09-10 ENCOUNTER — Inpatient Hospital Stay: Payer: BC Managed Care – PPO

## 2020-09-10 ENCOUNTER — Other Ambulatory Visit: Payer: BC Managed Care – PPO

## 2020-09-10 VITALS — BP 137/88 | HR 72 | Temp 97.5°F | Resp 18

## 2020-09-10 DIAGNOSIS — D751 Secondary polycythemia: Secondary | ICD-10-CM

## 2020-09-10 NOTE — Progress Notes (Signed)
Patient tolerated phlebotomy well, 350cc removed, no concerns voiced. Patient discharged. Stable.

## 2020-09-16 ENCOUNTER — Inpatient Hospital Stay: Payer: BC Managed Care – PPO

## 2020-09-20 ENCOUNTER — Inpatient Hospital Stay: Payer: BC Managed Care – PPO

## 2020-09-20 DIAGNOSIS — D751 Secondary polycythemia: Secondary | ICD-10-CM

## 2020-09-20 LAB — HEMOGLOBIN AND HEMATOCRIT, BLOOD
HCT: 48.2 % (ref 39.0–52.0)
Hemoglobin: 16.8 g/dL (ref 13.0–17.0)

## 2020-09-23 ENCOUNTER — Inpatient Hospital Stay: Payer: BC Managed Care – PPO

## 2020-09-30 ENCOUNTER — Inpatient Hospital Stay: Payer: BC Managed Care – PPO

## 2020-10-03 ENCOUNTER — Encounter: Payer: Self-pay | Admitting: *Deleted

## 2020-10-04 ENCOUNTER — Telehealth: Payer: Self-pay | Admitting: Oncology

## 2020-10-04 ENCOUNTER — Inpatient Hospital Stay: Payer: BC Managed Care – PPO

## 2020-10-04 NOTE — Telephone Encounter (Signed)
Patient called to cancel 10/04/20 appointment, patient stated that he would call back later to reschedule.

## 2020-10-07 ENCOUNTER — Inpatient Hospital Stay: Payer: BC Managed Care – PPO

## 2020-10-14 ENCOUNTER — Inpatient Hospital Stay: Payer: BC Managed Care – PPO

## 2020-10-21 ENCOUNTER — Inpatient Hospital Stay: Payer: BC Managed Care – PPO

## 2020-10-28 ENCOUNTER — Inpatient Hospital Stay: Payer: BC Managed Care – PPO

## 2020-11-01 ENCOUNTER — Telehealth: Payer: Self-pay | Admitting: Oncology

## 2020-11-01 NOTE — Telephone Encounter (Signed)
Pt called to cancel his appts on 2/10. He stated that he would like to come back in March but declined to schedule at this time. Pt stated that he would call back when ready.

## 2020-11-04 ENCOUNTER — Inpatient Hospital Stay: Payer: BC Managed Care – PPO

## 2020-11-04 ENCOUNTER — Inpatient Hospital Stay: Payer: BC Managed Care – PPO | Admitting: Oncology

## 2020-12-06 ENCOUNTER — Encounter: Payer: Self-pay | Admitting: Oncology

## 2021-04-15 ENCOUNTER — Encounter: Payer: Self-pay | Admitting: *Deleted

## 2021-12-07 ENCOUNTER — Emergency Department: Payer: BC Managed Care – PPO

## 2021-12-07 ENCOUNTER — Inpatient Hospital Stay
Admission: EM | Admit: 2021-12-07 | Discharge: 2021-12-10 | DRG: 287 | Disposition: A | Discharge status: Home / Self Care | Payer: BC Managed Care – PPO | Attending: Student | Admitting: Student

## 2021-12-07 ENCOUNTER — Other Ambulatory Visit: Payer: Self-pay

## 2021-12-07 ENCOUNTER — Encounter: Payer: Self-pay | Admitting: Intensive Care

## 2021-12-07 DIAGNOSIS — I25110 Atherosclerotic heart disease of native coronary artery with unstable angina pectoris: Secondary | ICD-10-CM | POA: Diagnosis not present

## 2021-12-07 DIAGNOSIS — Z7902 Long term (current) use of antithrombotics/antiplatelets: Secondary | ICD-10-CM

## 2021-12-07 DIAGNOSIS — G4733 Obstructive sleep apnea (adult) (pediatric): Secondary | ICD-10-CM | POA: Diagnosis present

## 2021-12-07 DIAGNOSIS — E119 Type 2 diabetes mellitus without complications: Secondary | ICD-10-CM

## 2021-12-07 DIAGNOSIS — Z20822 Contact with and (suspected) exposure to covid-19: Secondary | ICD-10-CM | POA: Diagnosis present

## 2021-12-07 DIAGNOSIS — E785 Hyperlipidemia, unspecified: Secondary | ICD-10-CM | POA: Diagnosis present

## 2021-12-07 DIAGNOSIS — Z79899 Other long term (current) drug therapy: Secondary | ICD-10-CM

## 2021-12-07 DIAGNOSIS — Z955 Presence of coronary angioplasty implant and graft: Secondary | ICD-10-CM

## 2021-12-07 DIAGNOSIS — Z794 Long term (current) use of insulin: Secondary | ICD-10-CM

## 2021-12-07 DIAGNOSIS — I252 Old myocardial infarction: Secondary | ICD-10-CM

## 2021-12-07 DIAGNOSIS — F419 Anxiety disorder, unspecified: Secondary | ICD-10-CM | POA: Diagnosis present

## 2021-12-07 DIAGNOSIS — R079 Chest pain, unspecified: Secondary | ICD-10-CM | POA: Diagnosis not present

## 2021-12-07 DIAGNOSIS — I200 Unstable angina: Secondary | ICD-10-CM

## 2021-12-07 DIAGNOSIS — Z91199 Patient's noncompliance with other medical treatment and regimen due to unspecified reason: Secondary | ICD-10-CM

## 2021-12-07 DIAGNOSIS — I2510 Atherosclerotic heart disease of native coronary artery without angina pectoris: Secondary | ICD-10-CM | POA: Diagnosis present

## 2021-12-07 DIAGNOSIS — D751 Secondary polycythemia: Secondary | ICD-10-CM | POA: Diagnosis present

## 2021-12-07 DIAGNOSIS — I10 Essential (primary) hypertension: Secondary | ICD-10-CM | POA: Diagnosis present

## 2021-12-07 DIAGNOSIS — R55 Syncope and collapse: Principal | ICD-10-CM

## 2021-12-07 DIAGNOSIS — Z7982 Long term (current) use of aspirin: Secondary | ICD-10-CM

## 2021-12-07 DIAGNOSIS — Z981 Arthrodesis status: Secondary | ICD-10-CM

## 2021-12-07 DIAGNOSIS — Z95820 Peripheral vascular angioplasty status with implants and grafts: Secondary | ICD-10-CM

## 2021-12-07 LAB — BASIC METABOLIC PANEL
Anion gap: 9 (ref 5–15)
BUN: 19 mg/dL (ref 6–20)
CO2: 23 mmol/L (ref 22–32)
Calcium: 9 mg/dL (ref 8.9–10.3)
Chloride: 104 mmol/L (ref 98–111)
Creatinine, Ser: 0.75 mg/dL (ref 0.61–1.24)
GFR, Estimated: >60 mL/min (ref >60)
Glucose, Bld: 249 mg/dL — ABNORMAL HIGH (ref 70–99)
Potassium: 4.4 mmol/L (ref 3.5–5.1)
Sodium: 136 mmol/L (ref 135–145)

## 2021-12-07 LAB — CBG MONITORING, ED: Glucose-Capillary: 131 mg/dL — ABNORMAL HIGH (ref 70–99)

## 2021-12-07 LAB — TROPONIN I (HIGH SENSITIVITY)
Troponin I (High Sensitivity): 6 ng/L (ref <18)
Troponin I (High Sensitivity): 6 ng/L (ref <18)

## 2021-12-07 LAB — PROTIME-INR
INR: 1 (ref 0.8–1.2)
Prothrombin Time: 13.2 seconds (ref 11.4–15.2)

## 2021-12-07 LAB — RESP PANEL BY RT-PCR (FLU A&B, COVID) ARPGX2
Influenza A by PCR: NEGATIVE
Influenza B by PCR: NEGATIVE
SARS Coronavirus 2 by RT PCR: NEGATIVE

## 2021-12-07 LAB — CBC
HCT: 55.8 % — ABNORMAL HIGH (ref 39.0–52.0)
Hemoglobin: 18.7 g/dL — ABNORMAL HIGH (ref 13.0–17.0)
MCH: 29.3 pg (ref 26.0–34.0)
MCHC: 33.5 g/dL (ref 30.0–36.0)
MCV: 87.3 fL (ref 80.0–100.0)
Platelets: 255 10*3/uL (ref 150–400)
RBC: 6.39 MIL/uL — ABNORMAL HIGH (ref 4.22–5.81)
RDW: 13.7 % (ref 11.5–15.5)
WBC: 6.8 10*3/uL (ref 4.0–10.5)
nRBC: 0 % (ref 0.0–0.2)

## 2021-12-07 LAB — CK: Total CK: 144 U/L (ref 49–397)

## 2021-12-07 MED ORDER — OXYCODONE HCL 5 MG PO TABS
5.0000 mg | ORAL_TABLET | ORAL | Status: DC | PRN
  Administered 2021-12-08: 5 mg via ORAL
  Filled 2021-12-07: qty 1

## 2021-12-07 MED ORDER — METHOCARBAMOL 1000 MG/10ML IJ SOLN
500.0000 mg | Freq: Four times a day (QID) | INTRAVENOUS | Status: DC | PRN
  Filled 2021-12-07: qty 5

## 2021-12-07 MED ORDER — ENOXAPARIN SODIUM 40 MG/0.4ML IJ SOSY
40.0000 mg | PREFILLED_SYRINGE | INTRAMUSCULAR | Status: DC
  Administered 2021-12-08 – 2021-12-09 (×3): 40 mg via SUBCUTANEOUS
  Filled 2021-12-07 (×3): qty 0.4

## 2021-12-07 MED ORDER — SODIUM CHLORIDE 0.9 % IV SOLN: 250.0000 mL | INTRAVENOUS | Status: DC | PRN

## 2021-12-07 MED ORDER — ONDANSETRON HCL 4 MG PO TABS: 4.0000 mg | ORAL_TABLET | Freq: Four times a day (QID) | ORAL | Status: DC | PRN

## 2021-12-07 MED ORDER — SODIUM CHLORIDE 0.9% FLUSH: 3.0000 mL | INTRAVENOUS | Status: DC | PRN

## 2021-12-07 MED ORDER — ICOSAPENT ETHYL 1 G PO CAPS: 2.0000 g | ORAL_CAPSULE | ORAL | Status: DC

## 2021-12-07 MED ORDER — INSULIN ASPART 100 UNIT/ML IJ SOLN
0.0000 [IU] | Freq: Three times a day (TID) | INTRAMUSCULAR | Status: DC
  Administered 2021-12-08: 3 [IU] via SUBCUTANEOUS
  Administered 2021-12-08: 5 [IU] via SUBCUTANEOUS
  Administered 2021-12-08: 2 [IU] via SUBCUTANEOUS
  Administered 2021-12-09 – 2021-12-10 (×3): 5 [IU] via SUBCUTANEOUS
  Administered 2021-12-10: 3 [IU] via SUBCUTANEOUS
  Filled 2021-12-07 (×8): qty 1

## 2021-12-07 MED ORDER — ASPIRIN EC 81 MG PO TBEC
81.0000 mg | DELAYED_RELEASE_TABLET | Freq: Every day | ORAL | Status: DC
  Administered 2021-12-08 – 2021-12-10 (×3): 81 mg via ORAL
  Filled 2021-12-07 (×3): qty 1

## 2021-12-07 MED ORDER — BISACODYL 5 MG PO TBEC: 5.0000 mg | DELAYED_RELEASE_TABLET | Freq: Every day | ORAL | Status: DC | PRN

## 2021-12-07 MED ORDER — ONDANSETRON HCL 4 MG/2ML IJ SOLN
4.0000 mg | Freq: Once | INTRAMUSCULAR | Status: AC
Start: 2021-12-07 — End: 2021-12-07
  Administered 2021-12-07: 4 mg via INTRAVENOUS
  Filled 2021-12-07: qty 2

## 2021-12-07 MED ORDER — SODIUM CHLORIDE 0.9% FLUSH
3.0000 mL | Freq: Two times a day (BID) | INTRAVENOUS | Status: DC
  Administered 2021-12-07 – 2021-12-09 (×4): 3 mL via INTRAVENOUS

## 2021-12-07 MED ORDER — MELATONIN 5 MG PO TABS
5.0000 mg | ORAL_TABLET | Freq: Every day | ORAL | Status: DC
  Administered 2021-12-07 – 2021-12-09 (×3): 5 mg via ORAL
  Filled 2021-12-07 (×3): qty 1

## 2021-12-07 MED ORDER — ASPIRIN 81 MG PO CHEW
324.0000 mg | CHEWABLE_TABLET | Freq: Once | ORAL | Status: AC
  Administered 2021-12-07: 324 mg via ORAL
  Filled 2021-12-07: qty 4

## 2021-12-07 MED ORDER — MORPHINE SULFATE (PF) 2 MG/ML IV SOLN
2.0000 mg | INTRAVENOUS | Status: DC | PRN
  Administered 2021-12-08 – 2021-12-09 (×8): 2 mg via INTRAVENOUS
  Filled 2021-12-07 (×8): qty 1

## 2021-12-07 MED ORDER — ONDANSETRON HCL 4 MG/2ML IJ SOLN: 4.0000 mg | Freq: Four times a day (QID) | INTRAMUSCULAR | Status: DC | PRN

## 2021-12-07 MED ORDER — TRAZODONE HCL 50 MG PO TABS
50.0000 mg | ORAL_TABLET | Freq: Every day | ORAL | Status: DC
Start: 2021-12-07 — End: 2021-12-10
  Administered 2021-12-07 – 2021-12-09 (×3): 50 mg via ORAL
  Filled 2021-12-07 (×3): qty 1

## 2021-12-07 MED ORDER — NITROGLYCERIN 0.4 MG SL SUBL: 0.4000 mg | SUBLINGUAL_TABLET | SUBLINGUAL | Status: DC | PRN

## 2021-12-07 MED ORDER — ACETAMINOPHEN 325 MG PO TABS
650.0000 mg | ORAL_TABLET | ORAL | Status: DC | PRN
  Administered 2021-12-08: 650 mg via ORAL
  Filled 2021-12-07: qty 2

## 2021-12-07 MED ORDER — INSULIN GLARGINE-YFGN 100 UNIT/ML ~~LOC~~ SOLN
10.0000 [IU] | Freq: Two times a day (BID) | SUBCUTANEOUS | Status: DC
  Administered 2021-12-08 – 2021-12-10 (×5): 10 [IU] via SUBCUTANEOUS
  Filled 2021-12-07 (×6): qty 0.1

## 2021-12-07 MED ORDER — GABAPENTIN 100 MG PO CAPS
200.0000 mg | ORAL_CAPSULE | Freq: Three times a day (TID) | ORAL | Status: DC
  Administered 2021-12-07 – 2021-12-10 (×8): 200 mg via ORAL
  Filled 2021-12-07 (×9): qty 2

## 2021-12-07 MED ORDER — IOHEXOL 350 MG/ML SOLN
100.0000 mL | Freq: Once | INTRAVENOUS | Status: AC | PRN
  Administered 2021-12-07: 100 mL via INTRAVENOUS
  Filled 2021-12-07: qty 100

## 2021-12-07 MED ORDER — CLOPIDOGREL BISULFATE 75 MG PO TABS
75.0000 mg | ORAL_TABLET | Freq: Every day | ORAL | Status: DC
  Administered 2021-12-08 – 2021-12-10 (×3): 75 mg via ORAL
  Filled 2021-12-07 (×3): qty 1

## 2021-12-07 MED ORDER — EZETIMIBE 10 MG PO TABS
10.0000 mg | ORAL_TABLET | Freq: Every day | ORAL | Status: DC
  Administered 2021-12-08 – 2021-12-10 (×3): 10 mg via ORAL
  Filled 2021-12-07 (×3): qty 1

## 2021-12-07 MED ORDER — MORPHINE SULFATE (PF) 4 MG/ML IV SOLN
4.0000 mg | Freq: Once | INTRAVENOUS | Status: AC
  Administered 2021-12-07: 4 mg via INTRAVENOUS
  Filled 2021-12-07: qty 1

## 2021-12-07 MED ORDER — ISOSORBIDE MONONITRATE ER 30 MG PO TB24
30.0000 mg | ORAL_TABLET | Freq: Every day | ORAL | Status: DC
  Administered 2021-12-07 – 2021-12-10 (×3): 30 mg via ORAL
  Filled 2021-12-07 (×3): qty 1

## 2021-12-07 MED ORDER — METOPROLOL SUCCINATE ER 25 MG PO TB24
25.0000 mg | ORAL_TABLET | Freq: Every day | ORAL | Status: DC
  Administered 2021-12-09 – 2021-12-10 (×2): 25 mg via ORAL
  Filled 2021-12-07 (×2): qty 1

## 2021-12-07 NOTE — H&P (Signed)
[facility] ?History and Physical ? ? ?Patient: Chris Norris WCH:[id number]   ?PCP: Remi Haggard, FNP DOB: 1961/07/01   ?DOA: 12/07/2021   DOS: 12/07/2021   ?DOS: the patient was seen and examined on 12/07/2021 ? ?Patient coming from: The patient is coming from Home ? ?Chief Complaint: Chest pain ? ?HPI: OBE AHLERS is a 61 y.o. male with Past medical history of CAD stable multiple stents in the past, HTN, HLD, IDDM T2, anxiety, cervical fusion, as reviewed from EMR, presented with chest pain started in the afternoon.  Patient had a syncopal episode and fall found himself on the floor, complaining of tingling sensation from cervical area radiating to bilateral arms.  Chest pain 10/10 midsternal and left-sided, increased with walking and relieved with rest.  As per patient he is more concerned about the pain as he was told that he may need CABG in the past if he started having chest pain again. ?Currently chest pain is 5/10, still having some tingling sensation on the right side of the neck muscles.  Denied any palpitations, no abdominal pain, no headache or dizziness. ? ?ED Course:  ?EKG negative for ST elevation ?Troponin negative x2 ?Blood glucose 249 slightly elevated ?Hemoglobin 18.7, patient has history of polycythemia ?XR negative for any acute findings ?CT head, CT cervical spine and CT angio chest abdomen pelvis negative for any acute findings ? ? ? ?Review of Systems: as mentioned in the history of present illness.  ?All other systems reviewed and are negative. ? ?Past Medical History:  ?Diagnosis Date  ? Coronary artery disease   ? Diabetes mellitus without complication ([place])   ? Heart disease   ? Myocardial infarction [facility]   ? X 3 STENTS LAST 10 YEARS AGO  ? Sleep apnea in adult   ? Type 2 diabetes mellitus without complication, with long-term current use of insulin ([place]) 07/10/2019  ? ?Past Surgical History:  ?Procedure Laterality Date  ? BICEPS TENDON REPAIR    ? BICEPS TENDON REPAIR Right  11/28/2018  ? Procedure: OPEN SUBPECTORAL REPAIR OF THE LONG HEAD OF BICEP TENDON-RIGHT SHOULDER;  Surgeon: Corky Mull, MD;  Location: [facility] ORS;  Service: Orthopedics;  Laterality: Right;  ? CORONARY ANGIOPLASTY    ? X 3  ? CORONARY STENT INTERVENTION N/A 07/09/2019  ? Procedure: CORONARY STENT INTERVENTION;  Surgeon: Wellington Hampshire, MD;  Location: [facility];  Service: Cardiovascular;  Laterality: N/A;  Distal RCA, Prox RCA, 1st Diag  ? KNEE ARTHROSCOPY W/ MENISCAL REPAIR    ? LEFT HEART CATH AND CORONARY ANGIOGRAPHY N/A 07/07/2019  ? Procedure: LEFT HEART CATH AND CORONARY ANGIOGRAPHY with cors and possible pci and stent;  Surgeon: Yolonda Kida, MD;  Location: [facility];  Service: Cardiovascular;  Laterality: N/A;  ? STENT PLACEMENT VASCULAR ([place] HX)    ? X 3 LAST 10 YEARS AGO  ? TRICEPS TENDON REPAIR    ? ?Social History:  reports that he has never smoked. He has never used smokeless tobacco. He reports that he does not currently use alcohol. He reports that he does not use drugs. ? ?No Known Allergies ? ? ?Family history reviewed and not pertinent ?History reviewed. No pertinent family history. ? ? ?Prior to Admission medications   ?Medication Sig Start Date End Date Taking? Authorizing Provider  ?acetaminophen (TYLENOL) 325 MG tablet Take 2 tablets (650 mg total) by mouth every 4 (four) hours as needed for headache or mild pain. 07/10/19  Isaiah Serge, NP  ?aspirin EC 81 MG tablet Take 1 tablet (81 mg total) by mouth daily. 07/10/19   Isaiah Serge, NP  ?clopidogrel (PLAVIX) 75 MG tablet TAKE 1 TABLET (75 MG TOTAL) BY MOUTH DAILY WITH BREAKFAST. 09/01/19   Isaiah Serge, NP  ?diclofenac (VOLTAREN) 50 MG EC tablet Take 50 mg by mouth daily.    [provider]  ?ezetimibe (ZETIA) 10 MG tablet TAKE 1 TABLET BY MOUTH EVERY DAY ?Patient not taking: Reported on 08/31/2020 09/01/19   Isaiah Serge, NP  ?Icosapent Ethyl (VASCEPA) 1 g CAPS Take 2 g by mouth every morning.     [provider]  ?insulin aspart (NOVOLOG) 100 UNIT/ML injection Inject 0-10 Units into the skin as needed. FOR HIGH BLOOD SUGAR  USUALLY 2 X MONTH    [provider]  ?Insulin Glargine (TOUJEO MAX SOLOSTAR) 300 UNIT/ML SOPN Inject 10 Units into the skin 2 (two) times daily.    [provider]  ?metoprolol succinate (TOPROL-XL) 25 MG 24 hr tablet TAKE 1 TABLET BY MOUTH EVERY DAY 09/01/19   Isaiah Serge, NP  ?nitroGLYCERIN (NITROSTAT) 0.4 MG SL tablet Place 1 tablet (0.4 mg total) under the tongue every 5 (five) minutes as needed for chest pain. 07/10/19   Isaiah Serge, NP  ? ? ?Physical Exam: ?Vitals:  ? 12/07/21 1234 12/07/21 1432 12/07/21 1530 12/07/21 1600  ?BP: 136/77 135/88 124/81 120/87  ?Pulse: 90 84 72 73  ?Resp: (!) '22 20 17 16  '$ ?Temp: 98.2 ?F (36.8 ?C)     ?TempSrc: Oral     ?SpO2: 95% 94% 93% 95%  ?Weight:      ?Height:      ? ? ?General: alert and oriented to time, place, and person. Appear in mild distress, affect depressed ?Eyes: PERRLA, Conjunctiva normal ?ENT: Oral Mucosa Clear, moist  ?Neck: no JVD, no Abnormal Mass Or lumps ?Cardiovascular: S1 and S2 Present, no Murmur, peripheral pulses symmetrical ?Respiratory: good respiratory effort, Bilateral Air entry equal and Decreased, signs of accessory muscle use, Clear to Auscultation, no Crackles, no wheezes ?Abdomen: Bowel Sound present, Soft and no tenderness, no hernia ?Skin: no rashes  ?Extremities: non Pedal edema, o calf tenderness ?Neurologic: without any new focal findings ?Gait not checked due to patient safety concerns ? ?Data Reviewed: ?I have personally reviewed and interpreted labs, imaging as discussed below. ? ?CBC: ?Recent Labs  ?Lab 12/07/21 ?1232  ?WBC 6.8  ?HGB 18.7*  ?HCT 55.8*  ?MCV 87.3  ?PLT 255  ? ?Basic Metabolic Panel: ?Recent Labs  ?Lab 12/07/21 ?1232  ?NA 136  ?K 4.4  ?CL 104  ?CO2 23  ?GLUCOSE 249*  ?BUN 19  ?CREATININE 0.75  ?CALCIUM 9.0  ? ?GFR: ?Estimated Creatinine Clearance: 100.6 mL/min  (by C-G formula based on SCr of 0.75 mg/dL). ?Liver Function Tests: ?No results for input(s): AST, ALT, ALKPHOS, BILITOT, PROT, ALBUMIN in the last 168 hours. ?No results for input(s): LIPASE, AMYLASE in the last 168 hours. ?No results for input(s): AMMONIA in the last 168 hours. ?Coagulation Profile: ?Recent Labs  ?Lab 12/07/21 ?1232  ?INR 1.0  ? ?Cardiac Enzymes: ?Recent Labs  ?Lab 12/07/21 ?1444  ?CKTOTAL 144  ? ?BNP (last 3 results) ?No results for input(s): PROBNP in the last 8760 hours. ?HbA1C: ?No results for input(s): HGBA1C in the last 72 hours. ?CBG: ?No results for input(s): GLUCAP in the last 168 hours. ?Lipid Profile: ?No results for input(s): CHOL, HDL, LDLCALC, TRIG, CHOLHDL, LDLDIRECT  in the last 72 hours. ?Thyroid Function Tests: ?No results for input(s): TSH, T4TOTAL, FREET4, T3FREE, THYROIDAB in the last 72 hours. ?Anemia Panel: ?No results for input(s): VITAMINB12, FOLATE, FERRITIN, TIBC, IRON, RETICCTPCT in the last 72 hours. ?Urine analysis: ?   ?Component Value Date/Time  ? COLORURINE YELLOW (A) 02/15/2017 1544  ? APPEARANCEUR CLEAR (A) 02/15/2017 1544  ? LABSPEC 1.017 02/15/2017 1544  ? PHURINE 6.0 02/15/2017 1544  ? GLUCOSEU >=500 (A) 02/15/2017 1544  ? HGBUR NEGATIVE 02/15/2017 1544  ? BILIRUBINUR NEGATIVE 02/15/2017 1544  ? KETONESUR 5 (A) 02/15/2017 1544  ? PROTEINUR NEGATIVE 02/15/2017 1544  ? NITRITE NEGATIVE 02/15/2017 1544  ? LEUKOCYTESUR NEGATIVE 02/15/2017 1544  ? ? ?Radiological Exams on Admission: ?DG Chest 2 View ? ?Result Date: 12/07/2021 ?CLINICAL DATA:  Chest pain EXAM: CHEST - 2 VIEW COMPARISON:  07/07/2019 FINDINGS: The lungs are clear without focal pneumonia, edema, pneumothorax or pleural effusion. Interstitial markings are diffusely coarsened with chronic features. Linear atelectasis or scarring noted right base. The cardiopericardial silhouette is within normal limits for size. The visualized bony structures of the thorax are unremarkable. IMPRESSION: No active  cardiopulmonary disease. Electronically Signed   By: Misty Stanley M.D.   On: 12/07/2021 12:58  ? ?CT HEAD WO CONTRAST (5MM) ? ?Result Date: 12/07/2021 ?CLINICAL DATA:  Head trauma EXAM: CT HEAD WITHOUT CONTR

## 2021-12-07 NOTE — ED Notes (Signed)
Fsbs 131 ?

## 2021-12-07 NOTE — ED Notes (Signed)
EKG completed. Substernal chest pain subsided Dr. Nickolas Madrid in to reassess patient while completing EKG.  ?

## 2021-12-07 NOTE — ED Notes (Signed)
A&Ox4. Skin p/w/d. RR even and non-labored. C/o exertional L sided chest pain x 1 week. States that he had "fallen" and woke up on the floor. Previous neck surgery/fusion. C/o L and R sided neck pain. Ambulated to bathroom, holding onto the wall. Stated that he can feel pins and needles to the R side of his neck. Previous multiple MI hx with stenting. Was told he needed a CABG at the start of Covid but didn't follow up. At rest Chest pain 3/10. ?

## 2021-12-07 NOTE — ED Notes (Signed)
Dr Nickolas Madrid notified patient is c/o tingling on the R side of his neck.  ?

## 2021-12-07 NOTE — ED Triage Notes (Addendum)
Patient presents with sharp, central chest pain that started around 12:00pm today. Reports tingling in right neck and through shoulder. Reports falling today in his workshop and unsure if he passed out or not. HX multiple MIs ?

## 2021-12-07 NOTE — ED Provider Notes (Signed)
? ?[facility] ?Provider Note ? ? ? Event Date/Time  ? First MD Initiated Contact with Patient 12/07/21 1349   ?  (approximate) ? ? ?History  ? ?Chest Pain ? ? ?HPI ? ?Chris Norris is a 61 y.o. male with diabetes, coronary disease, MI who comes in with chest pain.  Patient reports having chest pain for the past week.  He states it gets worse with exertion and better with rest.  He states that right now he is got very minimal chest pain may be a 3 out of 10.  However today he had an episode where he woke up on the ground.  He is not sure how he got there he thinks he might of blacked out.  He does report some chest pain prior to it.  He denies any shortness of breath.  Denies any abdominal pain, leg swelling.  He reports a little bit of tingling in his neck but he states that he has a history of having surgery on his neck so he thinks he could have injured that. ? ? ?Physical Exam  ? ?Triage Vital Signs: ?ED Triage Vitals  ?Enc Vitals Group  ?   BP 12/07/21 1234 136/77  ?   Pulse Rate 12/07/21 1234 90  ?   Resp 12/07/21 1234 (!) 22  ?   Temp 12/07/21 1234 98.2 ?F (36.8 ?C)  ?   Temp Source 12/07/21 1234 Oral  ?   SpO2 12/07/21 1234 95 %  ?   Weight 12/07/21 1228 180 lb (81.6 kg)  ?   Height 12/07/21 1228 '5\' 9"'$  (1.753 m)  ?   Head Circumference --   ?   Peak Flow --   ?   Pain Score 12/07/21 1228 10  ?   Pain Loc --   ?   Pain Edu? --   ?   Excl. in [place]? --   ? ? ?Most recent vital signs: ?Vitals:  ? 12/07/21 1234  ?BP: 136/77  ?Pulse: 90  ?Resp: (!) 22  ?Temp: 98.2 ?F (36.8 ?C)  ?SpO2: 95%  ? ? ? ?General: Awake, no distress.  ?CV:  Good peripheral perfusion.  ?Resp:  Normal effort.  ?Abd:  No distention.  Soft and nontender ?Other:  Equal radial pulses.  Equal DP pulses ? ? ?ED Results / Procedures / Treatments  ? ?Labs ?(all labs ordered are listed, but only abnormal results are displayed) ?Labs Reviewed  ?BASIC METABOLIC PANEL - Abnormal; Notable for the following components:  ?    Result  Value  ? Glucose, Bld 249 (*)   ? All other components within normal limits  ?CBC - Abnormal; Notable for the following components:  ? RBC 6.39 (*)   ? Hemoglobin 18.7 (*)   ? HCT 55.8 (*)   ? All other components within normal limits  ?PROTIME-INR  ?TROPONIN I (HIGH SENSITIVITY)  ? ? ? ?EKG ? ?My interpretation of EKG: ? ?Normal sinus rate of 91 without any ST elevation, T wave inversion in lead III, normal intervals ? ?Normal sinus rate of 74 without any ST elevation or T wave inversions, normal intervals ? ?RADIOLOGY ?I have reviewed the CT head personally, no evidence of intercranial hemorrhage ? ?CT neck pending ? ?Chest x-ray without any evidence of widened mediastinum ? ?PROCEDURES: ? ?Critical Care performed: No ? ?Procedures ? ? ?MEDICATIONS ORDERED IN ED: ?Medications - No data to display ? ? ?IMPRESSION / MDM / ASSESSMENT AND PLAN / ED  COURSE  ?I reviewed the triage vital signs and the nursing notes. ?             ?               ? ?Patient comes in with some intermittent exertional chest pain which sounds concerning for potential stable angina but then had a syncopal episode today. ? ?BMP shows elevated glucose but no evidence of DKA ?Hemoglobin is elevated but similar to prior ?Initial troponin was negative ? ?Given the concern for syncope and potentially hitting his head CT imaging was ordered which was negative. ? ?4:00 PM patient now reporting severe pain in his chest that lasted 10 minutes with some numbness and tingling down his arm.  Will get CTA to make sure no dissection ? ?CT scan is negative.  Given patient reports high risk chest pain will discuss with hospital team for admission.  Currently patient not having any significant pain we will hold off on heparin given recent head trauma and give aspirin only at this time ? ? ? ? ?FINAL CLINICAL IMPRESSION(S) / ED DIAGNOSES  ? ?Final diagnoses:  ?Syncope and collapse  ?Chest pain, unspecified type  ? ? ? ?Rx / DC Orders  ? ?ED Discharge Orders    ? ? None  ? ?  ? ? ? ?Note:  This document was prepared using Dragon voice recognition software and may include unintentional dictation errors. ?  ?Vanessa [place], MD ?12/07/21 1701 ? ?

## 2021-12-07 NOTE — ED Notes (Signed)
Pt given 3 Ojs ?

## 2021-12-07 NOTE — ED Notes (Signed)
Dr. Nickolas Madrid notified that patient states the is having extreme substernal chest pain.  ?

## 2021-12-08 ENCOUNTER — Inpatient Hospital Stay
Admit: 2021-12-08 | Discharge: 2021-12-08 | Disposition: A | Discharge status: Home / Self Care | Payer: BC Managed Care – PPO | Attending: Cardiology | Admitting: Cardiology

## 2021-12-08 DIAGNOSIS — I10 Essential (primary) hypertension: Secondary | ICD-10-CM | POA: Diagnosis present

## 2021-12-08 DIAGNOSIS — Z91199 Patient's noncompliance with other medical treatment and regimen due to unspecified reason: Secondary | ICD-10-CM | POA: Diagnosis not present

## 2021-12-08 DIAGNOSIS — Z20822 Contact with and (suspected) exposure to covid-19: Secondary | ICD-10-CM | POA: Diagnosis present

## 2021-12-08 DIAGNOSIS — R079 Chest pain, unspecified: Secondary | ICD-10-CM | POA: Diagnosis present

## 2021-12-08 DIAGNOSIS — Z7902 Long term (current) use of antithrombotics/antiplatelets: Secondary | ICD-10-CM | POA: Diagnosis not present

## 2021-12-08 DIAGNOSIS — Z981 Arthrodesis status: Secondary | ICD-10-CM | POA: Diagnosis not present

## 2021-12-08 DIAGNOSIS — I252 Old myocardial infarction: Secondary | ICD-10-CM | POA: Diagnosis not present

## 2021-12-08 DIAGNOSIS — I25110 Atherosclerotic heart disease of native coronary artery with unstable angina pectoris: Secondary | ICD-10-CM | POA: Diagnosis present

## 2021-12-08 DIAGNOSIS — Z794 Long term (current) use of insulin: Secondary | ICD-10-CM | POA: Diagnosis not present

## 2021-12-08 DIAGNOSIS — Z955 Presence of coronary angioplasty implant and graft: Secondary | ICD-10-CM | POA: Diagnosis not present

## 2021-12-08 DIAGNOSIS — G4733 Obstructive sleep apnea (adult) (pediatric): Secondary | ICD-10-CM | POA: Diagnosis present

## 2021-12-08 DIAGNOSIS — E785 Hyperlipidemia, unspecified: Secondary | ICD-10-CM | POA: Diagnosis present

## 2021-12-08 DIAGNOSIS — Z79899 Other long term (current) drug therapy: Secondary | ICD-10-CM | POA: Diagnosis not present

## 2021-12-08 DIAGNOSIS — D751 Secondary polycythemia: Secondary | ICD-10-CM | POA: Diagnosis present

## 2021-12-08 DIAGNOSIS — F419 Anxiety disorder, unspecified: Secondary | ICD-10-CM | POA: Diagnosis present

## 2021-12-08 DIAGNOSIS — Z7982 Long term (current) use of aspirin: Secondary | ICD-10-CM | POA: Diagnosis not present

## 2021-12-08 LAB — BASIC METABOLIC PANEL
Anion gap: 8 (ref 5–15)
BUN: 21 mg/dL — ABNORMAL HIGH (ref 6–20)
CO2: 25 mmol/L (ref 22–32)
Calcium: 8.5 mg/dL — ABNORMAL LOW (ref 8.9–10.3)
Chloride: 102 mmol/L (ref 98–111)
Creatinine, Ser: 0.81 mg/dL (ref 0.61–1.24)
GFR, Estimated: >60 mL/min (ref >60)
Glucose, Bld: 223 mg/dL — ABNORMAL HIGH (ref 70–99)
Potassium: 3.8 mmol/L (ref 3.5–5.1)
Sodium: 135 mmol/L (ref 135–145)

## 2021-12-08 LAB — CBC
HCT: 50.3 % (ref 39.0–52.0)
Hemoglobin: 16.8 g/dL (ref 13.0–17.0)
MCH: 29.2 pg (ref 26.0–34.0)
MCHC: 33.4 g/dL (ref 30.0–36.0)
MCV: 87.5 fL (ref 80.0–100.0)
Platelets: 233 10*3/uL (ref 150–400)
RBC: 5.75 MIL/uL (ref 4.22–5.81)
RDW: 14 % (ref 11.5–15.5)
WBC: 8.1 10*3/uL (ref 4.0–10.5)
nRBC: 0 % (ref 0.0–0.2)

## 2021-12-08 LAB — GLUCOSE, CAPILLARY
Glucose-Capillary: 164 mg/dL — ABNORMAL HIGH (ref 70–99)
Glucose-Capillary: 173 mg/dL — ABNORMAL HIGH (ref 70–99)

## 2021-12-08 LAB — CBG MONITORING, ED
Glucose-Capillary: 240 mg/dL — ABNORMAL HIGH (ref 70–99)
Glucose-Capillary: 145 mg/dL — ABNORMAL HIGH (ref 70–99)

## 2021-12-08 MED ORDER — SODIUM CHLORIDE 0.9% FLUSH
3.0000 mL | Freq: Two times a day (BID) | INTRAVENOUS | Status: DC
  Administered 2021-12-08 – 2021-12-09 (×3): 3 mL via INTRAVENOUS

## 2021-12-08 NOTE — Progress Notes (Signed)
Patient has a cath in the morning. Patient is currently on a Heart Healthy diet and will be NPO at midnight. ? ?Night medications administered with water. Blood Glucose 173 at 8 pm. Insulin administered per orders ? ?Consents for procedure will be obtained ?

## 2021-12-08 NOTE — Progress Notes (Signed)
Transport requested to move patient to assigned room.  ?

## 2021-12-08 NOTE — Progress Notes (Addendum)
Echo performed in patient's room between 8:30 pm and 9:30 pm. ? ?Administered meds after that ?

## 2021-12-08 NOTE — Progress Notes (Addendum)
[facility] Progress Note ? ?Patient: Chris Norris    KGM:[id number]  DOA: 12/07/2021    ? ?Date of Service: the patient was seen and examined on 12/08/2021 ? ?Chief Complaint  ?Patient presents with  ? Chest Pain  ? ?Brief hospital course: ?Chris Norris is a 61 y.o. male with Past medical history of CAD stable multiple stents in the past, HTN, HLD, IDDM T2, anxiety, cervical fusion, as reviewed from EMR, presented with chest pain started in the afternoon.  Patient had a syncopal episode and fall found himself on the floor, complaining of tingling sensation from cervical area radiating to bilateral arms.  Chest pain 10/10 midsternal and left-sided, increased with walking and relieved with rest.  As per patient he is more concerned about the pain as he was told that he may need CABG in the past if he started having chest pain again. ?Currently chest pain is 5/10, still having some tingling sensation on the right side of the neck muscles.  Denied any palpitations, no abdominal pain, no headache or dizziness. ?  ?ED Course:  ?EKG negative for ST elevation ?Troponin negative x2 ?Blood glucose 249 slightly elevated ?Hemoglobin 18.7, patient has history of polycythemia ?XR negative for any acute findings ?CT head, CT cervical spine and CT angio chest abdomen pelvis negative for any acute findings ? ? ?Assessment and Plan: ?Principal Problem: ?  Chest pain ?  ?  ?Chest pain, recent report of multiple stents in the past ?EKG negative for ST elevation ?Troponin x2 negative ?Continue to monitor on telemetry ?Continue aspirin Plavix, nitroglycerin as needed for chest pain ?Started Imdur 30 mg p.o. daily ?Cardiology consulted, recommended cardiac catheter tomorrow a.m. ?Follow 2D echocardiogram ?Keep n.p.o. after midnight ?  ?  ?Syncope, unknown cause could be orthostatic hypotension ?Check orthostatic vital signs ?Continue fall precautions ?  ?  ?Neck pain, patient had cervical fusion in the past ?Neck pain got worse  after fall, CT neck negative for any fractures ?Continue as needed medication for pain control ?Started gabapentin 200 mg p.o. 3 times daily ?  ?  ?HTN, HLD ?Received metoprolol and Zetia Home medications ?  ?  ?IDDM T2, continued insulin glargine 10 units twice daily ?Started Humalog sliding scale, monitor FSBG ?Continue diabetic diet ? ? ? ?Body mass index is 26.58 kg/m?.  ?Interventions: ?  ? ?   ?Diet: Cardiac and Carb modified diet ?DVT Prophylaxis: Subcutaneous Lovenox  ? ?Advance goals of care discussion: Full code ? ?Family Communication: family was NOT present at bedside, at the time of interview.  ?The pt provided permission to discuss medical plan with the family. Opportunity was given to ask question and all questions were answered satisfactorily.  ? ?Disposition:  ?Pt is from Home, admitted with chest pain, still has chest pain, cardiology planned to do cardiac cath tomorrow a.m., which precludes a safe discharge. ?Discharge to home, when stable and cleared by cardiology to discharge. ? ?Subjective: No significant events overnight, patient did sleep well but is still had off-and-on pain in the chest and neck.  Patient was given pain medications, now easily able to better but still has chest pain 6/10. ?Patient agreed for the cardiac cath which will be done tomorrow a.m. ? ?Physical Exam: ?General:  alert oriented to time, place, and person.  ?Appear in no distress, affect depressed ?Eyes: PERRLA ?ENT: Oral Mucosa Clear, moist  ?Neck: no JVD,  ?Cardiovascular: S1 and S2 Present, no Murmur,  ?Respiratory: good respiratory effort, Bilateral Air entry  equal and Decreased, no Crackles, no wheezes ?Abdomen: Bowel Sound present, Soft and no tenderness,  ?Skin: no rashes ?Extremities: no Pedal edema, no calf tenderness ?Neurologic: without any new focal findings ?Gait not checked due to patient safety concerns ? ?Vitals:  ? 12/08/21 0947 12/08/21 0949 12/08/21 0951 12/08/21 1030  ?BP: 1'02/61 94/68 99/72 '$ 110/68   ?Pulse: 64 73 84 69  ?Resp: 20 16 (!) 21 15  ?Temp:      ?TempSrc:      ?SpO2: 94% 96% 97% 99%  ?Weight:      ?Height:      ? ?No intake or output data in the 24 hours ending 12/08/21 1328 ?Filed Weights  ? 12/07/21 1228  ?Weight: 81.6 kg  ? ? ?Data Reviewed: ?I have personally reviewed and interpreted daily labs, tele strips, imagings as discussed above. ?I reviewed all nursing notes, pharmacy notes, vitals, pertinent old records ?I have discussed plan of care as described above with RN and patient/family. ? ?CBC: ?Recent Labs  ?Lab 12/07/21 ?1232 12/08/21 ?1017  ?WBC 6.8 8.1  ?HGB 18.7* 16.8  ?HCT 55.8* 50.3  ?MCV 87.3 87.5  ?PLT 255 233  ? ?Basic Metabolic Panel: ?Recent Labs  ?Lab 12/07/21 ?1232 12/08/21 ?5102  ?NA 136 135  ?K 4.4 3.8  ?CL 104 102  ?CO2 23 25  ?GLUCOSE 249* 223*  ?BUN 19 21*  ?CREATININE 0.75 0.81  ?CALCIUM 9.0 8.5*  ? ? ?Studies: ?CT HEAD WO CONTRAST (5MM) ? ?Result Date: 12/07/2021 ?CLINICAL DATA:  Head trauma EXAM: CT HEAD WITHOUT CONTRAST TECHNIQUE: Contiguous axial images were obtained from the base of the skull through the vertex without intravenous contrast. RADIATION DOSE REDUCTION: This exam was performed according to the departmental dose-optimization program which includes automated exposure control, adjustment of the mA and/or kV according to patient size and/or use of iterative reconstruction technique. COMPARISON:  08/09/2004 FINDINGS: Brain: No acute infarct or hemorrhage. Lateral ventricles and midline structures are unremarkable. No acute extra-axial fluid collections. No mass effect. Vascular: No hyperdense vessel or unexpected calcification. Skull: Normal. Negative for fracture or focal lesion. Sinuses/Orbits: No acute finding. Other: None. IMPRESSION: 1. No acute intracranial process. Electronically Signed   By: Randa Ngo M.D.   On: 12/07/2021 15:02  ? ?CT Cervical Spine Wo Contrast ? ?Result Date: 12/07/2021 ?CLINICAL DATA:  Neck trauma EXAM: CT CERVICAL SPINE WITHOUT  CONTRAST TECHNIQUE: Multidetector CT imaging of the cervical spine was performed without intravenous contrast. Multiplanar CT image reconstructions were also generated. RADIATION DOSE REDUCTION: This exam was performed according to the departmental dose-optimization program which includes automated exposure control, adjustment of the mA and/or kV according to patient size and/or use of iterative reconstruction technique. COMPARISON:  None. FINDINGS: Alignment: Alignment is grossly anatomic. Skull base and vertebrae: Prior C5 and C6 corpectomy with strut graft in place and anterior fusion plate and screws spanning C4 through C7. Lucency surrounding the left-sided screw at C7 may suggest motion at this location. No acute displaced fracture.  No destructive bony lesion. Soft tissues and spinal canal: No prevertebral fluid or swelling. No visible canal hematoma. Disc levels: Prior corpectomy at C5 and C6 with anterior fusion spanning C4 through C7. Mild spondylosis at C3-4 and C7-T1. Moderate multilevel facet hypertrophy, greatest on the right from C2-3 through C5-6. Upper chest: Airway is patent.  Lung apices are clear. Other: Reconstructed images demonstrate no additional findings. IMPRESSION: 1. No acute displaced fracture. 2. Postsurgical changes spanning C4 through C7 as above. Electronically Signed  By: Randa Ngo M.D.   On: 12/07/2021 15:05  ? ?CT Angio Chest/Abd/Pel for Dissection W and/or Wo Contrast ? ?Result Date: 12/07/2021 ?CLINICAL DATA:  Acute aortic syndrome suspected. EXAM: CT ANGIOGRAPHY CHEST, ABDOMEN AND PELVIS TECHNIQUE: Multidetector CT imaging through the chest, abdomen and pelvis was performed using the standard protocol during bolus administration of intravenous contrast. Multiplanar reconstructed images and MIPs were obtained and reviewed to evaluate the vascular anatomy. RADIATION DOSE REDUCTION: This exam was performed according to the departmental dose-optimization program which includes  automated exposure control, adjustment of the mA and/or kV according to patient size and/or use of iterative reconstruction technique. CONTRAST:  165m OMNIPAQUE IOHEXOL 350 MG/ML SOLN COMPARISON:  04/1

## 2021-12-08 NOTE — Consult Note (Addendum)
?[facility] CARDIOLOGY CONSULT NOTE  ? ?    ?Patient ID: ?Chris Norris ?MRN: [id number] ?DOB/AGE: 1961-04-17 61 y.o. ? ?Admit date: 12/07/2021 ?Referring Physician Dr. Dwyane Dee ?Primary Physician  ?Primary Cardiologist Dr. Ubaldo Glassing (last seen 2020) ?Reason for Consultation chest pain ? 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  Cardiology is consulted for further assistance. ? ?The patient is seen and examined in the emergency department this morning. He is initially incredibly unhappy with being n.p.o. today and having to stay in the emergency department all evening when he was told he would eventually have a bed on the floor.  He states that for the past 3 to 4 days he has been having chest pain on and off that is worse with exertion.  He states the pain initially starts in his ear/neck, radiates to his jaw and his left shoulder and ends up in his chest.  He states this is worse with exertion and is relieved by nitroglycerin. He states this is similar to when he had his heart attacks in the past.  He said yesterday the pain was so bad in his chest that he passed out.  He denies taking nitroglycerin and sildenafil at the same time. Admits to compliance with DAPT since his PCI in 2020. He says this pain was much relieved last night with morphine and currently complains of a headache and nausea because he hasn't eaten all morning.  ? ?Vitals are notable for blood pressure initially 136/77 with a low of 86/44 overnight, improving to 101/64 this morning, heart rate 70. Orthostatic vitals negative this morning.  ? ?Labs are notable for potassium of 3.8, elevated blood glucose  223, creatinine 0.81, GFR greater than 60.  Troponins x2 are flat at 6-6.  H&H 16.8, 50.3, platelets 233. ? ?Review of systems and history limited by patient noncompliance with interview.  ? ?Past Medical History:  ?Diagnosis Date  ? Coronary artery disease   ? Diabetes mellitus without complication ([place])   ? Heart disease   ? Myocardial infarction [facility])   ? X 3 STENTS LAST 10 YEARS AGO  ? Sleep apnea in adult   ? Type 2 diabetes mellitus without complication, with long-term current use of insulin ([place]) 07/10/2019  ?  ?Past Surgical History:  ?Procedure Laterality Date  ? BICEPS TENDON REPAIR    ? BICEPS TENDON REPAIR Right 11/28/2018  ? Procedure: OPEN SUBPECTORAL REPAIR OF THE LONG HEAD OF BICEP TENDON-RIGHT SHOULDER;  Surgeon: Corky Mull, MD;  Location: [facility] ORS;  Service: Orthopedics;  Laterality: Right;  ? CORONARY ANGIOPLASTY    ? X 3  ? CORONARY STENT INTERVENTION N/A 07/09/2019  ? Procedure: CORONARY STENT INTERVENTION;  Surgeon: Wellington Hampshire, MD;  Location: [facility];  Service: Cardiovascular;  Laterality: N/A;  Distal RCA, Prox RCA, 1st Diag  ? KNEE ARTHROSCOPY W/ MENISCAL REPAIR    ? LEFT HEART CATH AND CORONARY ANGIOGRAPHY N/A 07/07/2019  ? Procedure: LEFT HEART CATH AND CORONARY ANGIOGRAPHY with cors and possible pci and stent;  Surgeon: Yolonda Kida, MD;  Location: [facility];  Service: Cardiovascular;  Laterality: N/A;  ? STENT  PLACEMENT VASCULAR ([place] [facility])    ? X 3 LAST 10 YEARS AGO  ? TRICEPS TENDON REPAIR    ?  ?(Not in a hospital admission) ? ?Social History  ? ?Socioeconomic History  ? Marital status: Single  ?  Spouse name: Not on file  ? Number of children: Not on file  ? Years of education: Not on file  ? Highest education level: Not on file  ?Occupational History  ? Not on file  ?Tobacco Use  ? Smoking status: Never  ? Smokeless tobacco: Never  ? Tobacco comments:  ?  occasional cigars  ?Vaping Use  ? Vaping Use: Never used  ?Substance and Sexual Activity  ?  Alcohol use: Not Currently  ? Drug use: Never  ? Sexual activity: Yes  ?  Birth control/protection: None  ?Other Topics Concern  ? Not on file  ?Social History Narrative  ? Not on file  ? ?Social Determinants of Health  ? ?Financial Resource Strain: Not on file  ?Food Insecurity: Not on file  ?Transportation Needs: Not on file  ?Physical Activity: Not on file  ?Stress: Not on file  ?Social Connections: Not on file  ?Intimate Partner Violence: Not on file  ?  ?History reviewed. No pertinent family history.  ? ? ?Review of systems complete and found to be negative unless listed above  ? ? ?PHYSICAL EXAM ?General: Caucasian male, well nourished, in no acute distress. ?HEENT:  Normocephalic and atraumatic. ?Neck:  No JVD.  ?Lungs: Normal respiratory effort on room air. Clear bilaterally to auscultation. No wheezes, crackles, rhonchi.  ?Heart: HRRR . Normal S1 and S2 without gallops or murmurs. Radial & DP pulses 2+ bilaterally. ?Abdomen: Non-distended appearing.  ?Msk: Normal strength and tone for age. ?Extremities: Warm and well perfused. No clubbing, cyanosis. no edema.  ?Neuro: Alert and oriented X 3. ?Psych: mood angry, but able to calmed somewhat throughout interview.  ? ?Labs: ?  ?Lab Results  ?Component Value Date  ? WBC 8.1 12/08/2021  ? HGB 16.8 12/08/2021  ? HCT 50.3 12/08/2021  ? MCV 87.5 12/08/2021  ? PLT 233 12/08/2021  ?  ?Recent Labs  ?Lab 12/08/21 ?1287  ?NA 135  ?K 3.8  ?CL 102  ?CO2 25  ?BUN 21*  ?CREATININE 0.81  ?CALCIUM 8.5*  ?GLUCOSE 223*  ? ?Lab Results  ?Component Value Date  ? CKTOTAL 144 12/07/2021  ? CKMB 9.9 (H) 08/19/2013  ? TROPONINI <0.03 01/18/2019  ?  ?Lab Results  ?Component Value Date  ? CHOL 133 07/08/2019  ? ?Lab Results  ?Component Value Date  ? HDL 27 (L) 07/08/2019  ? ?Lab Results  ?Component Value Date  ? [place] 65 07/08/2019  ? ?Lab Results  ?Component Value Date  ? TRIG 206 (H) 07/08/2019  ? ?Lab Results  ?Component Value Date  ? CHOLHDL 4.9 07/08/2019  ? ?No results found  for: LDLDIRECT  ?  ?Radiology: DG Chest 2 View ? ?Result Date: 12/07/2021 ?CLINICAL DATA:  Chest pain EXAM: CHEST - 2 VIEW COMPARISON:  07/07/2019 FINDINGS: The lungs are clear without focal pneumonia, edema, pneumothorax or pleural effusion. Interstitial markings are diffusely coarsened with chronic features. Linear atelectasis or scarring noted right base. The cardiopericardial silhouette is within normal limits for size. The visualized bony structures of the thorax are unremarkable. IMPRESSION: No active cardiopulmonary disease. Electronically Signed   By: Misty Stanley M.D.   On: 12/07/2021 12:58  ? ?CT HEAD WO CONTRAST (5MM) ? ?Result Date: 12/07/2021 ?CLINICAL DATA:  Head trauma EXAM: CT HEAD WITHOUT CONTRAST TECHNIQUE: Contiguous axial images were obtained from the base of the skull through the vertex without intravenous contrast. RADIATION DOSE REDUCTION: This exam was performed according to the departmental dose-optimization program which includes automated exposure control, adjustment of the mA and/or kV according to patient size and/or use of iterative reconstruction technique. COMPARISON:  08/09/2004 FINDINGS: Brain: No acute infarct or hemorrhage. Lateral ventricles and midline structures are unremarkable. No acute extra-axial fluid collections. No mass effect. Vascular: No hyperdense vessel or unexpected calcification. Skull: Normal. Negative for fracture or focal lesion. Sinuses/Orbits: No acute finding. Other: None. IMPRESSION: 1. No acute intracranial process. Electronically Signed   By: Randa Ngo M.D.   On: 12/07/2021 15:02  ? ?CT Cervical Spine Wo Contrast ? ?Result Date: 12/07/2021 ?CLINICAL DATA:  Neck trauma EXAM: CT CERVICAL SPINE WITHOUT CONTRAST TECHNIQUE: Multidetector CT imaging of the cervical spine was performed without intravenous contrast. Multiplanar CT image reconstructions were also generated. RADIATION DOSE REDUCTION: This exam was performed according to the departmental  dose-optimization program which includes automated exposure control, adjustment of the mA and/or kV according to patient size and/or use of iterative reconstruction technique. COMPARISON:  None. FINDINGS: Alignment: Al

## 2021-12-09 ENCOUNTER — Encounter
Admission: EM | Disposition: A | Discharge status: Home / Self Care | Payer: Self-pay | Source: Home / Self Care | Attending: Student

## 2021-12-09 DIAGNOSIS — R079 Chest pain, unspecified: Secondary | ICD-10-CM | POA: Diagnosis not present

## 2021-12-09 HISTORY — PX: LEFT HEART CATH AND CORONARY ANGIOGRAPHY: CATH118249

## 2021-12-09 LAB — CBC
HCT: 50.1 % (ref 39.0–52.0)
Hemoglobin: 16.9 g/dL (ref 13.0–17.0)
MCH: 29.2 pg (ref 26.0–34.0)
MCHC: 33.7 g/dL (ref 30.0–36.0)
MCV: 86.5 fL (ref 80.0–100.0)
Platelets: 228 10*3/uL (ref 150–400)
RBC: 5.79 MIL/uL (ref 4.22–5.81)
RDW: 13.5 % (ref 11.5–15.5)
WBC: 6.6 10*3/uL (ref 4.0–10.5)
nRBC: 0 % (ref 0.0–0.2)

## 2021-12-09 LAB — BASIC METABOLIC PANEL
Anion gap: 8 (ref 5–15)
BUN: 21 mg/dL — ABNORMAL HIGH (ref 6–20)
CO2: 25 mmol/L (ref 22–32)
Calcium: 8.9 mg/dL (ref 8.9–10.3)
Chloride: 102 mmol/L (ref 98–111)
Creatinine, Ser: 0.68 mg/dL (ref 0.61–1.24)
GFR, Estimated: >60 mL/min (ref >60)
Glucose, Bld: 178 mg/dL — ABNORMAL HIGH (ref 70–99)
Potassium: 3.7 mmol/L (ref 3.5–5.1)
Sodium: 135 mmol/L (ref 135–145)

## 2021-12-09 LAB — ECHOCARDIOGRAM COMPLETE
Area-P 1/2: 2.76 cm2
Height: 69 in
S' Lateral: 2.8 cm
Weight: 2864 oz

## 2021-12-09 LAB — GLUCOSE, CAPILLARY
Glucose-Capillary: 213 mg/dL — ABNORMAL HIGH (ref 70–99)
Glucose-Capillary: 185 mg/dL — ABNORMAL HIGH (ref 70–99)
Glucose-Capillary: 270 mg/dL — ABNORMAL HIGH (ref 70–99)
Glucose-Capillary: 230 mg/dL — ABNORMAL HIGH (ref 70–99)
Glucose-Capillary: 308 mg/dL — ABNORMAL HIGH (ref 70–99)
Glucose-Capillary: 210 mg/dL — ABNORMAL HIGH (ref 70–99)

## 2021-12-09 LAB — MAGNESIUM: Magnesium: 2.3 mg/dL (ref 1.7–2.4)

## 2021-12-09 LAB — HEMOGLOBIN A1C
Hgb A1c MFr Bld: 10.8 % — ABNORMAL HIGH (ref 4.8–5.6)
Mean Plasma Glucose: 263 mg/dL

## 2021-12-09 LAB — PHOSPHORUS: Phosphorus: 5 mg/dL — ABNORMAL HIGH (ref 2.5–4.6)

## 2021-12-09 SURGERY — LEFT HEART CATH AND CORONARY ANGIOGRAPHY
Anesthesia: Moderate Sedation

## 2021-12-09 MED ORDER — SODIUM CHLORIDE 0.9% FLUSH: 3.0000 mL | INTRAVENOUS | Status: DC | PRN

## 2021-12-09 MED ORDER — ACETAMINOPHEN 325 MG PO TABS: 650.0000 mg | ORAL_TABLET | ORAL | Status: DC | PRN

## 2021-12-09 MED ORDER — SODIUM CHLORIDE 0.9 % IV BOLUS
INTRAVENOUS | Status: AC | PRN
  Administered 2021-12-09: 250 mL via INTRAVENOUS

## 2021-12-09 MED ORDER — SODIUM CHLORIDE 0.9 % WEIGHT BASED INFUSION: 1.0000 mL/kg/h | INTRAVENOUS | Status: AC

## 2021-12-09 MED ORDER — SODIUM CHLORIDE 0.9 % IV SOLN: 250.0000 mL | INTRAVENOUS | Status: DC | PRN

## 2021-12-09 MED ORDER — HYDRALAZINE HCL 20 MG/ML IJ SOLN: 10.0000 mg | INTRAMUSCULAR | Status: AC | PRN

## 2021-12-09 MED ORDER — IOHEXOL 300 MG/ML  SOLN
INTRAMUSCULAR | Status: DC | PRN
  Administered 2021-12-09: 62 mL

## 2021-12-09 MED ORDER — SODIUM CHLORIDE 0.9 % WEIGHT BASED INFUSION
1.0000 mL/kg/h | INTRAVENOUS | Status: DC
  Administered 2021-12-09: 1 mL/kg/h via INTRAVENOUS

## 2021-12-09 MED ORDER — FENTANYL CITRATE (PF) 100 MCG/2ML IJ SOLN
INTRAMUSCULAR | Status: DC | PRN
  Administered 2021-12-09: 25 ug via INTRAVENOUS

## 2021-12-09 MED ORDER — HEPARIN (PORCINE) IN NACL 1000-0.9 UT/500ML-% IV SOLN
INTRAVENOUS | Status: AC
  Filled 2021-12-09: qty 1000

## 2021-12-09 MED ORDER — SODIUM CHLORIDE 0.9 % WEIGHT BASED INFUSION
3.0000 mL/kg/h | INTRAVENOUS | Status: DC
  Administered 2021-12-09: 3 mL/kg/h via INTRAVENOUS

## 2021-12-09 MED ORDER — LIDOCAINE HCL 1 % IJ SOLN
INTRAMUSCULAR | Status: AC
  Filled 2021-12-09: qty 20

## 2021-12-09 MED ORDER — LABETALOL HCL 5 MG/ML IV SOLN: 10.0000 mg | INTRAVENOUS | Status: AC | PRN

## 2021-12-09 MED ORDER — ASPIRIN 81 MG PO CHEW: 81.0000 mg | CHEWABLE_TABLET | ORAL | Status: DC

## 2021-12-09 MED ORDER — FENTANYL CITRATE (PF) 100 MCG/2ML IJ SOLN
INTRAMUSCULAR | Status: AC
Start: 2021-12-09 — End: ?
  Filled 2021-12-09: qty 2

## 2021-12-09 MED ORDER — LIDOCAINE HCL (PF) 1 % IJ SOLN
INTRAMUSCULAR | Status: DC | PRN
  Administered 2021-12-09: 20 mL

## 2021-12-09 MED ORDER — MIDAZOLAM HCL 2 MG/2ML IJ SOLN
INTRAMUSCULAR | Status: AC
  Filled 2021-12-09: qty 2

## 2021-12-09 MED ORDER — MIDAZOLAM HCL 2 MG/2ML IJ SOLN
INTRAMUSCULAR | Status: DC | PRN
  Administered 2021-12-09: 1 mg via INTRAVENOUS

## 2021-12-09 MED ORDER — ONDANSETRON HCL 4 MG/2ML IJ SOLN: 4.0000 mg | Freq: Four times a day (QID) | INTRAMUSCULAR | Status: DC | PRN

## 2021-12-09 MED ORDER — HEPARIN (PORCINE) IN NACL 2000-0.9 UNIT/L-% IV SOLN
INTRAVENOUS | Status: DC | PRN
  Administered 2021-12-09: 1000 mL

## 2021-12-09 SURGICAL SUPPLY — 10 items
CATH INFINITI 5FR MULTPACK ANG (CATHETERS) ×1 IMPLANT
DEVICE CLOSURE MYNXGRIP 5F (Vascular Products) ×1 IMPLANT
NDL PERC 18GX7CM (NEEDLE) IMPLANT
NEEDLE PERC 18GX7CM (NEEDLE) ×2 IMPLANT
PACK CARDIAC CATH (CUSTOM PROCEDURE TRAY) ×2 IMPLANT
PROTECTION STATION PRESSURIZED (MISCELLANEOUS) ×2
SET ATX SIMPLICITY (MISCELLANEOUS) ×1 IMPLANT
SHEATH AVANTI 5FR X 11CM (SHEATH) ×1 IMPLANT
STATION PROTECTION PRESSURIZED (MISCELLANEOUS) IMPLANT
WIRE GUIDERIGHT .035X150 (WIRE) ×1 IMPLANT

## 2021-12-09 NOTE — Progress Notes (Signed)
[facility] Cardiology [facility] Encounter Note ? ?Patient: Chris Norris / Admit Date: 12/07/2021 / Date of Encounter: 12/09/2021, 1:27 PM ? ? ?Subjective: ?Patient with no evidence of chest pain today.  No evidence of myocardial infarction with no acute coronary syndrome or congestive heart failure at this time ? ?Cardiac catheterization showing continued moderate atherosclerosis of coronary arteries with patent stent of right coronary artery and left anterior descending artery unchanged from previous.  Other native artery with moderate atherosclerosis of improved from before with appropriate medication management. ? ?Review of Systems: ?Positive for: None ?Negative for: Vision change, hearing change, syncope, dizziness, nausea, vomiting,diarrhea, bloody stool, stomach pain, cough, congestion, diaphoresis, urinary frequency, urinary pain,skin lesions, skin rashes ?Others previously listed ? ?Objective: ?Telemetry: Normal sinus rhythm ?Physical Exam: Blood pressure 100/63, pulse 72, temperature 98 ?F (36.7 ?C), temperature source Oral, resp. rate 16, height '5\' 9"'$  (1.753 m), weight 81.6 kg, SpO2 97 %. Body mass index is 26.58 kg/m?. ?General: Well developed, well nourished, in no acute distress. ?Head: Normocephalic, atraumatic, sclera non-icteric, no xanthomas, nares are without discharge. ?Neck: No apparent masses ?Lungs: Normal respirations with no wheezes, no rhonchi, no rales , no crackles  ? Heart: Regular rate and rhythm, normal S1 S2, no murmur, no rub, no gallop, PMI is normal size and placement, carotid upstroke normal without bruit, jugular venous pressure normal ?Abdomen: Soft, non-tender, non-distended with normoactive bowel sounds. No hepatosplenomegaly. Abdominal aorta is normal size without bruit ?Extremities: No edema, no clubbing, no cyanosis, no ulcers,  ?Peripheral: 2+ radial, 2+ femoral, 2+ dorsal pedal pulses ?Neuro: Alert and oriented. Moves all extremities spontaneously. ?Psych:  Responds  to questions appropriately with a normal affect. ? ? ?Intake/Output Summary (Last 24 hours) at 12/09/2021 1327 ?Last data filed at 12/08/2021 1903 ?Gross per 24 hour  ?Intake 240 ml  ?Output --  ?Net 240 ml  ? ? ?Inpatient Medications:  ? [MAR Hold] aspirin EC  81 mg Oral Daily  ? [MAR Hold] clopidogrel  75 mg Oral Q breakfast  ? [MAR Hold] enoxaparin (LOVENOX) injection  40 mg Subcutaneous Q24H  ? [MAR Hold] ezetimibe  10 mg Oral Daily  ? [MAR Hold] gabapentin  200 mg Oral TID  ? [MAR Hold] insulin aspart  0-15 Units Subcutaneous TID WC  ? [MAR Hold] insulin glargine-yfgn  10 Units Subcutaneous BID  ? [MAR Hold] isosorbide mononitrate  30 mg Oral Daily  ? [MAR Hold] melatonin  5 mg Oral QHS  ? [MAR Hold] metoprolol succinate  25 mg Oral Daily  ? [MAR Hold] sodium chloride flush  3 mL Intravenous Q12H  ? [MAR Hold] sodium chloride flush  3 mL Intravenous Q12H  ? [MAR Hold] traZODone  50 mg Oral QHS  ? ?Infusions:  ? [MAR Hold] sodium chloride    ? sodium chloride    ? [START ON 12/10/2021] sodium chloride 3 mL/kg/hr (12/09/21 1227)  ? Followed by  ? [START ON 12/10/2021] sodium chloride 1 mL/kg/hr (12/09/21 1227)  ? [MAR Hold] methocarbamol (ROBAXIN) IV    ? sodium chloride 250 mL (12/09/21 1255)  ? ? ?Labs: ?Recent Labs  ?  12/08/21 ?8242 12/09/21 ?0409  ?NA 135 135  ?K 3.8 3.7  ?CL 102 102  ?CO2 25 25  ?GLUCOSE 223* 178*  ?BUN 21* 21*  ?CREATININE 0.81 0.68  ?CALCIUM 8.5* 8.9  ?MG  --  2.3  ?PHOS  --  5.0*  ? ?No results for input(s): AST, ALT, ALKPHOS, BILITOT, PROT, ALBUMIN in the last  72 hours. ?Recent Labs  ?  12/08/21 ?7371 12/09/21 ?0409  ?WBC 8.1 6.6  ?HGB 16.8 16.9  ?HCT 50.3 50.1  ?MCV 87.5 86.5  ?PLT 233 228  ? ?Recent Labs  ?  12/07/21 ?1444  ?CKTOTAL 144  ? ?Invalid input(s): POCBNP ?Recent Labs  ?  12/07/21 ?0626  ?HGBA1C 10.8*  ?  ? ?Weights: ?Filed Weights  ? 12/07/21 1228 12/08/21 1655 12/09/21 0400  ?Weight: 81.6 kg 81.2 kg 81.6 kg  ? ? ? ?Radiology/Studies:  ?DG Chest 2 View ? ?Result Date:  12/07/2021 ?CLINICAL DATA:  Chest pain EXAM: CHEST - 2 VIEW COMPARISON:  07/07/2019 FINDINGS: The lungs are clear without focal pneumonia, edema, pneumothorax or pleural effusion. Interstitial markings are diffusely coarsened with chronic features. Linear atelectasis or scarring noted right base. The cardiopericardial silhouette is within normal limits for size. The visualized bony structures of the thorax are unremarkable. IMPRESSION: No active cardiopulmonary disease. Electronically Signed   By: Misty Stanley M.D.   On: 12/07/2021 12:58  ? ?CT HEAD WO CONTRAST (5MM) ? ?Result Date: 12/07/2021 ?CLINICAL DATA:  Head trauma EXAM: CT HEAD WITHOUT CONTRAST TECHNIQUE: Contiguous axial images were obtained from the base of the skull through the vertex without intravenous contrast. RADIATION DOSE REDUCTION: This exam was performed according to the departmental dose-optimization program which includes automated exposure control, adjustment of the mA and/or kV according to patient size and/or use of iterative reconstruction technique. COMPARISON:  08/09/2004 FINDINGS: Brain: No acute infarct or hemorrhage. Lateral ventricles and midline structures are unremarkable. No acute extra-axial fluid collections. No mass effect. Vascular: No hyperdense vessel or unexpected calcification. Skull: Normal. Negative for fracture or focal lesion. Sinuses/Orbits: No acute finding. Other: None. IMPRESSION: 1. No acute intracranial process. Electronically Signed   By: Randa Ngo M.D.   On: 12/07/2021 15:02  ? ?CT Cervical Spine Wo Contrast ? ?Result Date: 12/07/2021 ?CLINICAL DATA:  Neck trauma EXAM: CT CERVICAL SPINE WITHOUT CONTRAST TECHNIQUE: Multidetector CT imaging of the cervical spine was performed without intravenous contrast. Multiplanar CT image reconstructions were also generated. RADIATION DOSE REDUCTION: This exam was performed according to the departmental dose-optimization program which includes automated exposure control,  adjustment of the mA and/or kV according to patient size and/or use of iterative reconstruction technique. COMPARISON:  None. FINDINGS: Alignment: Alignment is grossly anatomic. Skull base and vertebrae: Prior C5 and C6 corpectomy with strut graft in place and anterior fusion plate and screws spanning C4 through C7. Lucency surrounding the left-sided screw at C7 may suggest motion at this location. No acute displaced fracture.  No destructive bony lesion. Soft tissues and spinal canal: No prevertebral fluid or swelling. No visible canal hematoma. Disc levels: Prior corpectomy at C5 and C6 with anterior fusion spanning C4 through C7. Mild spondylosis at C3-4 and C7-T1. Moderate multilevel facet hypertrophy, greatest on the right from C2-3 through C5-6. Upper chest: Airway is patent.  Lung apices are clear. Other: Reconstructed images demonstrate no additional findings. IMPRESSION: 1. No acute displaced fracture. 2. Postsurgical changes spanning C4 through C7 as above. Electronically Signed   By: Randa Ngo M.D.   On: 12/07/2021 15:05  ? ?CARDIAC CATHETERIZATION ? ?Result Date: 12/09/2021 ?  Prox LAD to Mid LAD lesion is 20% stenosed.   Mid LAD to Dist LAD lesion is 60% stenosed.   Prox RCA lesion is 20% stenosed.   RPAV-1 lesion is 20% stenosed.   RPAV-2 lesion is 60% stenosed.   Non-stenotic Mid LAD lesion was previously treated.  Non-stenotic Dist RCA lesion was previously treated.   Non-stenotic Prox RCA to Mid RCA lesion was previously treated.   Non-stenotic 2nd Diag lesion was previously treated.   The left ventricular systolic function is normal.   LV end diastolic pressure is low.   The left ventricular ejection fraction is greater than 65% by visual estimate. 61 year old male with hypertension hyperlipidemia and extensive coronary artery disease having progression of unstable angina over the last several days to a week.  The patient did not have any acute coronary syndrome.  Echocardiogram previously has  shown normal LV systolic function Patent multiple stents without change in restenosis or atherosclerosis of right coronary artery and left anterior descending artery Continued moderate atherosclerosis of native

## 2021-12-09 NOTE — Progress Notes (Signed)
Patient returned from cath lab.  Resting comfortably.  R groin access site. Site clean and dry. No hematoma or bleeding noted. ?

## 2021-12-09 NOTE — Progress Notes (Signed)
2nd PIV placed in Left Antecubital ?

## 2021-12-09 NOTE — Progress Notes (Signed)
[facility] Progress Note ? ?Patient: Chris Norris    HRC:[id number]  DOA: 12/07/2021    ? ?Date of Service: the patient was seen and examined on 12/09/2021 ? ?Chief Complaint  ?Patient presents with  ? Chest Pain  ? ?Brief hospital course: ?Chris Norris is a 61 y.o. male with Past medical history of CAD stable multiple stents in the past, HTN, HLD, IDDM T2, anxiety, cervical fusion, as reviewed from EMR, presented with chest pain started in the afternoon.  Patient had a syncopal episode and fall found himself on the floor, complaining of tingling sensation from cervical area radiating to bilateral arms.  Chest pain 10/10 midsternal and left-sided, increased with walking and relieved with rest.  As per patient he is more concerned about the pain as he was told that he may need CABG in the past if he started having chest pain again. ?Currently chest pain is 5/10, still having some tingling sensation on the right side of the neck muscles.  Denied any palpitations, no abdominal pain, no headache or dizziness. ?  ?ED Course:  ?EKG negative for ST elevation ?Troponin negative x2 ?Blood glucose 249 slightly elevated ?Hemoglobin 18.7, patient has history of polycythemia ?XR negative for any acute findings ?CT head, CT cervical spine and CT angio chest abdomen pelvis negative for any acute findings ? ? ?Assessment and Plan: ?Principal Problem: ?  Chest pain ?  ?  ?Chest pain, recent report of multiple stents in the past ?EKG negative for ST elevation ?Troponin x2 negative ?Continue to monitor on telemetry ?Continue aspirin Plavix, nitroglycerin as needed for chest pain ?Started Imdur 30 mg p.o. daily ?Cardiology consulted, recommended cardiac cath, scheduled today ?Follow 2D echocardiogram, report is still pending ? ?  ?  ?Syncope, unknown cause could be orthostatic hypotension ?Check orthostatic vital signs ?Continue fall precautions ?  ?  ?Neck pain, patient had cervical fusion in the past ?Neck pain got worse  after fall, CT neck negative for any fractures ?Continue as needed medication for pain control ?Started gabapentin 200 mg p.o. 3 times daily ?  ?  ?HTN, HLD ?Received metoprolol and Zetia Home medications ?  ?  ?IDDM T2, continued insulin glargine 10 units twice daily ?Started Humalog sliding scale, monitor FSBG ?Continue diabetic diet ? ? ? ?Body mass index is 26.58 kg/m?.  ?Interventions: ?  ? ?   ?Diet: Cardiac and Carb modified diet ?DVT Prophylaxis: Subcutaneous Lovenox  ? ?Advance goals of care discussion: Full code ? ?Family Communication: family was NOT present at bedside, at the time of interview.  ?The pt provided permission to discuss medical plan with the family. Opportunity was given to ask question and all questions were answered satisfactorily.  ? ?Disposition:  ?Pt is from Home, admitted with chest pain, still has chest pain, cardiology planned to do cardiac cath today, which precludes a safe discharge. ?Discharge to home, when stable and cleared by cardiology to discharge. ? ?Subjective: No significant events overnight, patient still having off-and-on pain in the neck depends on his position of the neck, chest pain 5/10, slept good overnight. ?Patient is aware that he is scheduled for cardiac cath today, denies any other active issues. ? ? ?Physical Exam: ?General:  alert oriented to time, place, and person.  ?Appear in no distress, affect depressed ?Eyes: PERRLA ?ENT: Oral Mucosa Clear, moist  ?Neck: no JVD,  ?Cardiovascular: S1 and S2 Present, no Murmur,  ?Respiratory: good respiratory effort, Bilateral Air entry equal and Decreased, no Crackles, no  wheezes ?Abdomen: Bowel Sound present, Soft and no tenderness,  ?Skin: no rashes ?Extremities: no Pedal edema, no calf tenderness ?Neurologic: without any new focal findings ?Gait not checked due to patient safety concerns ? ?Vitals:  ? 12/09/21 1254 12/09/21 1259 12/09/21 1304 12/09/21 1320  ?BP: 107/72 107/64 (!) 96/56 100/63  ?Pulse: 64 61 63 72   ?Resp: '19 20 16 16  '$ ?Temp:      ?TempSrc:      ?SpO2: 96% 97% 96% 97%  ?Weight:      ?Height:      ? ? ?Intake/Output Summary (Last 24 hours) at 12/09/2021 1337 ?Last data filed at 12/08/2021 1903 ?Gross per 24 hour  ?Intake 240 ml  ?Output --  ?Net 240 ml  ? ?Filed Weights  ? 12/07/21 1228 12/08/21 1655 12/09/21 0400  ?Weight: 81.6 kg 81.2 kg 81.6 kg  ? ? ?Data Reviewed: ?I have personally reviewed and interpreted daily labs, tele strips, imagings as discussed above. ?I reviewed all nursing notes, pharmacy notes, vitals, pertinent old records ?I have discussed plan of care as described above with RN and patient/family. ? ?CBC: ?Recent Labs  ?Lab 12/07/21 ?1232 12/08/21 ?7253 12/09/21 ?0409  ?WBC 6.8 8.1 6.6  ?HGB 18.7* 16.8 16.9  ?HCT 55.8* 50.3 50.1  ?MCV 87.3 87.5 86.5  ?PLT 255 233 228  ? ?Basic Metabolic Panel: ?Recent Labs  ?Lab 12/07/21 ?1232 12/08/21 ?6644 12/09/21 ?0409  ?NA 136 135 135  ?K 4.4 3.8 3.7  ?CL 104 102 102  ?CO2 '23 25 25  '$ ?GLUCOSE 249* 223* 178*  ?BUN 19 21* 21*  ?CREATININE 0.75 0.81 0.68  ?CALCIUM 9.0 8.5* 8.9  ?MG  --   --  2.3  ?PHOS  --   --  5.0*  ? ? ?Studies: ?CARDIAC CATHETERIZATION ? ?Result Date: 12/09/2021 ?  Prox LAD to Mid LAD lesion is 20% stenosed.   Mid LAD to Dist LAD lesion is 60% stenosed.   Prox RCA lesion is 20% stenosed.   RPAV-1 lesion is 20% stenosed.   RPAV-2 lesion is 60% stenosed.   Non-stenotic Mid LAD lesion was previously treated.   Non-stenotic Dist RCA lesion was previously treated.   Non-stenotic Prox RCA to Mid RCA lesion was previously treated.   Non-stenotic 2nd Diag lesion was previously treated.   The left ventricular systolic function is normal.   LV end diastolic pressure is low.   The left ventricular ejection fraction is greater than 65% by visual estimate. 61 year old male with hypertension hyperlipidemia and extensive coronary artery disease having progression of unstable angina over the last several days to a week.  The patient did not have any acute  coronary syndrome.  Echocardiogram previously has shown normal LV systolic function Patent multiple stents without change in restenosis or atherosclerosis of right coronary artery and left anterior descending artery Continued moderate atherosclerosis of native arteries not previously stented without change and/or progression of atherosclerosis from previous cardiac catheterization Plan Maximal medication management for anginal symptoms and other causes of angina High intensity cholesterol therapy Isosorbide beta-blocker and calcium channel blocker for anginal symptoms Hypertension control with above Cardiac rehabilitation No further cardiac intervention at this time    ?Scheduled Meds: ? [MAR Hold] aspirin EC  81 mg Oral Daily  ? [MAR Hold] clopidogrel  75 mg Oral Q breakfast  ? [MAR Hold] enoxaparin (LOVENOX) injection  40 mg Subcutaneous Q24H  ? [MAR Hold] ezetimibe  10 mg Oral Daily  ? [MAR Hold] gabapentin  200 mg Oral  TID  ? [MAR Hold] insulin aspart  0-15 Units Subcutaneous TID WC  ? [MAR Hold] insulin glargine-yfgn  10 Units Subcutaneous BID  ? [MAR Hold] isosorbide mononitrate  30 mg Oral Daily  ? [MAR Hold] melatonin  5 mg Oral QHS  ? [MAR Hold] metoprolol succinate  25 mg Oral Daily  ? [MAR Hold] sodium chloride flush  3 mL Intravenous Q12H  ? [MAR Hold] sodium chloride flush  3 mL Intravenous Q12H  ? [MAR Hold] traZODone  50 mg Oral QHS  ? ?Continuous Infusions: ? [MAR Hold] sodium chloride    ? sodium chloride    ? [START ON 12/10/2021] sodium chloride 3 mL/kg/hr (12/09/21 1227)  ? Followed by  ? [START ON 12/10/2021] sodium chloride 1 mL/kg/hr (12/09/21 1227)  ? [MAR Hold] methocarbamol (ROBAXIN) IV    ? sodium chloride 250 mL (12/09/21 1255)  ? ?PRN Meds: [MAR Hold] sodium chloride, sodium chloride, [MAR Hold] acetaminophen, [MAR Hold] bisacodyl, fentaNYL, Heparin (Porcine) in NaCl, iohexol, lidocaine (PF), [MAR Hold] methocarbamol (ROBAXIN) IV, midazolam, [MAR Hold]  morphine injection, [MAR Hold]  nitroGLYCERIN, [MAR Hold] ondansetron **OR** [MAR Hold] ondansetron (ZOFRAN) IV, [MAR Hold] oxyCODONE, sodium chloride, [MAR Hold] sodium chloride flush ? ?Time spent: 35 minutes ? ?Author: ?Val Riles MD ?Tr

## 2021-12-09 NOTE — Progress Notes (Signed)
Patient rates pain at 6/10. Morphine administered. See MAR ?

## 2021-12-09 NOTE — Progress Notes (Signed)
Inpatient Diabetes Program Recommendations ? ?AACE/ADA: New Consensus Statement on Inpatient Glycemic Control (2015) ? ?Target Ranges:  Prepandial:   less than 140 mg/dL ?     Peak postprandial:   less than 180 mg/dL (1-2 hours) ?     Critically ill patients:  140 - 180 mg/dL  ? ?Lab Results  ?Component Value Date  ? GLUCAP 185 (H) 12/09/2021  ? HGBA1C 10.8 (H) 12/07/2021  ? ? ?Review of Glycemic Control ? Latest Reference Range & Units 12/08/21 17:09 12/08/21 20:39 12/09/21 07:50 12/09/21 11:42 12/09/21 12:05  ?Glucose-Capillary 70 - 99 mg/dL 164 (H) 173 (H) 213 (H) 270 (H) 185 (H)  ? ?Diabetes history: DM 2 ?Outpatient Diabetes medications:  ?Toujeo 10 units bid ?Fiasp injectable- ?Current orders for Inpatient glycemic control:  ?Novolog moderate tid with meals ?Semglee 10 units bid ?Inpatient Diabetes Program Recommendations:   ?Consider increasing Semglee to 12 units bid.  ? ?Thanks,  ?Adah Perl, RN, BC-ADM ?Inpatient Diabetes Coordinator ?Pager [phone]  (8a-5p) ? ? ? ?

## 2021-12-09 NOTE — Progress Notes (Signed)
Patient complains of pain 6/10. Morphine 2 mg administered. See MAR ?

## 2021-12-09 NOTE — Progress Notes (Signed)
Consent form signed. Placed in chart. ?

## 2021-12-10 LAB — GLUCOSE, CAPILLARY
Glucose-Capillary: 192 mg/dL — ABNORMAL HIGH (ref 70–99)
Glucose-Capillary: 216 mg/dL — ABNORMAL HIGH (ref 70–99)

## 2021-12-10 MED ORDER — MELATONIN 5 MG PO TABS: 5.0000 mg | ORAL_TABLET | Freq: Every day | ORAL | 2 refills | Status: AC

## 2021-12-10 MED ORDER — METOPROLOL SUCCINATE ER 25 MG PO TB24: 25.0000 mg | ORAL_TABLET | Freq: Every day | ORAL | 2 refills | Status: AC

## 2021-12-10 MED ORDER — ISOSORBIDE MONONITRATE ER 30 MG PO TB24: 30.0000 mg | ORAL_TABLET | Freq: Every day | ORAL | 2 refills | Status: AC

## 2021-12-10 MED ORDER — TRAZODONE HCL 50 MG PO TABS
50.0000 mg | ORAL_TABLET | Freq: Every evening | ORAL | 0 refills | Status: AC | PRN
Start: 2021-12-10 — End: 2022-01-09

## 2021-12-10 MED ORDER — GABAPENTIN 100 MG PO CAPS: 200.0000 mg | ORAL_CAPSULE | Freq: Three times a day (TID) | ORAL | 0 refills | Status: AC

## 2021-12-10 NOTE — Discharge Summary (Signed)
[facility] Discharge Summary ? ? ?Patient: Chris Norris PJK:[id number]  PCP: Remi Haggard, FNP  ?Date of admission: 12/07/2021   Date of discharge:  12/10/2021   ?  ?Discharge Diagnoses:  ?Principal Problem: ?  Chest pain ?Active Problems: ?  Anxiety ?  Coronary artery disease ?  HLD (hyperlipidemia) ?  HTN (hypertension) ?  S/P angioplasty with stent, 2 stents in RCA and 1 DES in mLAD 07/09/19  ?  Type 2 diabetes mellitus without complication, with long-term current use of insulin ([place]) ? ? ?Admitted From: Home ?Disposition:  Home  ? ?Recommendations for Outpatient Follow-up:  ?PCP: in 1 wk ?F/u Cardio in 1-2 wks  ?Follow up LABS/TEST:   ? ? ?Diet recommendation: Cardiac and Carb modified diet ? ?Activity: The patient is advised to gradually reintroduce usual activities, as tolerated ? ?Discharge Condition: stable ? ?Code Status: Full code  ? ?History of present illness: As per the H and P dictated on admission ?Hospital Course:  ?Chris Norris is a 61 y.o. male with Past medical history of CAD stable multiple stents in the past, HTN, HLD, IDDM T2, anxiety, cervical fusion, as reviewed from EMR, presented with chest pain started in the afternoon.  Patient had a syncopal episode and fall found himself on the floor, complaining of tingling sensation from cervical area radiating to bilateral arms.  Chest pain 10/10 midsternal and left-sided, increased with walking and relieved with rest.  As per patient he is more concerned about the pain as he was told that he may need CABG in the past if he started having chest pain again. ?Currently chest pain is 5/10, still having some tingling sensation on the right side of the neck muscles.  Denied any palpitations, no abdominal pain, no headache or dizziness. ?ED Course:  ?EKG negative for ST elevation, Troponin negative x2, Blood glucose 249 slightly elevated ?Hemoglobin 18.7, patient has history of polycythemia ?XR negative for any acute findings ?CT head, CT  cervical spine and CT angio chest abdomen pelvis negative for any acute findings ?Assessment and Plan: ?Principal Problem: ?  Chest pain ?  ?Chest pain, recent report of multiple stents in the past, EKG negative for ST elevation ?Troponin x2 negative, monitored on telemetry, no sig events, Continue aspirin Plavix, nitroglycerin as needed for chest pain. Started Imdur 30 mg p.o. daily. Cardiology consulted, recommended cardiac cath which was done on 12/09/2021, ?Conclusion ?  Prox LAD to Mid LAD lesion is 20% stenosed. ?  Mid LAD to Dist LAD lesion is 60% stenosed. ?  Prox RCA lesion is 20% stenosed. ?  RPAV-1 lesion is 20% stenosed. ?  RPAV-2 lesion is 60% stenosed. ?  Non-stenotic Mid LAD lesion was previously treated. ?  Non-stenotic Dist RCA lesion was previously treated. ?  Non-stenotic Prox RCA to Mid RCA lesion was previously treated. ?  Non-stenotic 2nd Diag lesion was previously treated. ?  The left ventricular systolic function is normal. ?  LV end diastolic pressure is low. ?  The left ventricular ejection fraction is greater than 65% by visual estimate. ?Plan ?Maximal medication management for anginal symptoms and other causes of angina ?High intensity cholesterol therapy ?Isosorbide beta-blocker and calcium channel blocker for anginal symptoms ?Hypertension control with above ?Cardiac rehabilitation ?No further cardiac intervention at this time ?  ?Syncope, unknown cause could be orthostatic hypotension, patient remained asymptomatic during hospital stay, walking around without any symptoms.  Patient refused orthostatic vital signs in the ED.  Patient was advised to  monitor BP at home and follow with PCP to titrate medications accordingly ?Neck pain, patient had cervical fusion in the past, Neck pain got worse after fall, CT neck negative for any fractures. Continue as needed medication for pain control, decreased gabapentin 200 mg p.o. 3 times daily ?HTN, HLD, Received metoprolol and Zetia Home  medications ?IDDM T2, resumed home dose insulin, patient was advised to monitor FSBG at home, continue diabetic diet and follow with PCP.   ?Body mass index is 26.44 kg/m?Marland Kitchen  ?Nutrition Interventions: ?   ?- Patient was instructed, not to drive, operate heavy machinery, perform activities at heights, swimming or participation in water activities or provide baby sitting services while on Pain, Sleep and Anxiety Medications; until his outpatient Physician has advised to do so again.  ?- Also recommended to not to take more than prescribed Pain, Sleep and Anxiety Medications. ? ?Patient was ambulatory without any assistance. ?On the day of the discharge the patient's vitals were stable, and no other acute medical condition were reported by patient. the patient was felt safe to be discharge at Home. ? ?Consultants: Cardiologist ?Procedures: Cardiac cath ? ?Discharge Exam: ?General: Appear in no distress, no Rash; Oral Mucosa Clear, moist. ?Cardiovascular: S1 and S2 Present, no Murmur, ?Respiratory: normal respiratory effort, Bilateral Air entry present and no Crackles, no wheezes ?Abdomen: Bowel Sound present, Soft and no tenderness, no hernia ?Extremities: no Pedal edema, no calf tenderness ?Neurology: alert and oriented to time, place, and person ?affect appropriate. ? ?Filed Weights  ? 12/08/21 1655 12/09/21 0400 12/10/21 0628  ?Weight: 81.2 kg 81.6 kg 81.2 kg  ? ?Vitals:  ? 12/10/21 0800 12/10/21 1116  ?BP: 119/75 97/69  ?Pulse: 60 67  ?Resp: 16 16  ?Temp: 97.9 ?F (36.6 ?C) 97.7 ?F (36.5 ?C)  ?SpO2: 96% 96%  ? ? ?DISCHARGE MEDICATION: ?Allergies as of 12/10/2021   ?No Known Allergies ?  ? ?  ?Medication List  ?  ? ?STOP taking these medications   ? ?gabapentin 600 MG tablet ?Commonly known as: NEURONTIN ?Replaced by: gabapentin 100 MG capsule ?  ? ?  ? ?TAKE these medications   ? ?acetaminophen 325 MG tablet ?Commonly known as: TYLENOL ?Take 2 tablets (650 mg total) by mouth every 4 (four) hours as needed for headache  or mild pain. ?  ?aspirin EC 81 MG tablet ?Take 1 tablet (81 mg total) by mouth daily. ?  ?clopidogrel 75 MG tablet ?Commonly known as: PLAVIX ?TAKE 1 TABLET (75 MG TOTAL) BY MOUTH DAILY WITH BREAKFAST. ?  ?Diclofenac Sodium CR 100 MG 24 hr tablet ?Take 100 mg by mouth daily as needed. ?  ?Fiasp FlexTouch 100 UNIT/ML FlexTouch Pen ?Generic drug: insulin aspart ?INJECTABLE FOLLOW SAME DIRECTIONS GIVEN ON NOVOLOG. ?  ?gabapentin 100 MG capsule ?Commonly known as: NEURONTIN ?Take 2 capsules (200 mg total) by mouth 3 (three) times daily. ?Replaces: gabapentin 600 MG tablet ?  ?isosorbide mononitrate 30 MG 24 hr tablet ?Commonly known as: IMDUR ?Take 1 tablet (30 mg total) by mouth daily. ?Start taking on: December 11, 2021 ?  ?melatonin 5 MG Tabs ?Take 1 tablet (5 mg total) by mouth at bedtime. ?  ?metoprolol succinate 25 MG 24 hr tablet ?Commonly known as: TOPROL-XL ?Take 1 tablet (25 mg total) by mouth daily. ?Start taking on: December 11, 2021 ?  ?nitroGLYCERIN 0.4 MG SL tablet ?Commonly known as: NITROSTAT ?Place 1 tablet (0.4 mg total) under the tongue every 5 (five) minutes as needed for chest pain. ?  ?  Repatha SureClick 003 MG/ML Soaj ?Generic drug: Evolocumab ?Inject 140 mg into the skin every 14 (fourteen) days. ?  ?Toujeo SoloStar 300 UNIT/ML Solostar Pen ?Generic drug: insulin glargine (1 Unit Dial) ?Inject 10 Units into the skin 2 (two) times daily. ?  ?traZODone 50 MG tablet ?Commonly known as: DESYREL ?Take 1 tablet (50 mg total) by mouth at bedtime as needed for sleep. ?  ? ?  ? ?No Known Allergies ?Discharge Instructions   ? ? AMB Referral to Cardiac Rehabilitation - Phase II   Complete by: As directed ?  ? Diagnosis: Stable Angina  ? Call MD for:  difficulty breathing, headache or visual disturbances   Complete by: As directed ?  ? Call MD for:  extreme fatigue   Complete by: As directed ?  ? Call MD for:  persistant dizziness or light-headedness   Complete by: As directed ?  ? Call MD for:  severe  uncontrolled pain   Complete by: As directed ?  ? Diet - low sodium heart healthy   Complete by: As directed ?  ? Discharge instructions   Complete by: As directed ?  ? Follow-up with PCP in 1 week, continue to monitor BP

## 2021-12-10 NOTE — Progress Notes (Signed)
Patient's vital signs have been stable this shift. Mynx closure is in tact, clean and dry. No swelling, no redness and no pain noted on assessment. Patient requested 2 mg morphine which was administered earlier in the shift. ? ? ?

## 2021-12-11 ENCOUNTER — Other Ambulatory Visit: Payer: Self-pay

## 2021-12-11 ENCOUNTER — Emergency Department: Payer: BC Managed Care – PPO

## 2021-12-11 ENCOUNTER — Emergency Department
Admission: EM | Admit: 2021-12-11 | Discharge: 2021-12-11 | Disposition: A | Discharge status: Home / Self Care | Payer: BC Managed Care – PPO | Attending: Emergency Medicine | Admitting: Emergency Medicine

## 2021-12-11 ENCOUNTER — Encounter: Payer: Self-pay | Admitting: Emergency Medicine

## 2021-12-11 DIAGNOSIS — R0789 Other chest pain: Secondary | ICD-10-CM | POA: Insufficient documentation

## 2021-12-11 DIAGNOSIS — R1084 Generalized abdominal pain: Secondary | ICD-10-CM | POA: Insufficient documentation

## 2021-12-11 DIAGNOSIS — R55 Syncope and collapse: Secondary | ICD-10-CM | POA: Insufficient documentation

## 2021-12-11 DIAGNOSIS — I2510 Atherosclerotic heart disease of native coronary artery without angina pectoris: Secondary | ICD-10-CM | POA: Insufficient documentation

## 2021-12-11 DIAGNOSIS — E1165 Type 2 diabetes mellitus with hyperglycemia: Secondary | ICD-10-CM | POA: Insufficient documentation

## 2021-12-11 DIAGNOSIS — I10 Essential (primary) hypertension: Secondary | ICD-10-CM | POA: Diagnosis not present

## 2021-12-11 DIAGNOSIS — F1410 Cocaine abuse, uncomplicated: Secondary | ICD-10-CM | POA: Insufficient documentation

## 2021-12-11 DIAGNOSIS — R079 Chest pain, unspecified: Secondary | ICD-10-CM

## 2021-12-11 DIAGNOSIS — R7989 Other specified abnormal findings of blood chemistry: Secondary | ICD-10-CM

## 2021-12-11 DIAGNOSIS — Z20822 Contact with and (suspected) exposure to covid-19: Secondary | ICD-10-CM | POA: Insufficient documentation

## 2021-12-11 DIAGNOSIS — D72829 Elevated white blood cell count, unspecified: Secondary | ICD-10-CM | POA: Diagnosis not present

## 2021-12-11 DIAGNOSIS — M542 Cervicalgia: Secondary | ICD-10-CM | POA: Insufficient documentation

## 2021-12-11 DIAGNOSIS — R739 Hyperglycemia, unspecified: Secondary | ICD-10-CM

## 2021-12-11 DIAGNOSIS — R000 Tachycardia, unspecified: Secondary | ICD-10-CM | POA: Insufficient documentation

## 2021-12-11 DIAGNOSIS — R791 Abnormal coagulation profile: Secondary | ICD-10-CM | POA: Diagnosis not present

## 2021-12-11 LAB — BASIC METABOLIC PANEL
Anion gap: 8 (ref 5–15)
BUN: 20 mg/dL (ref 6–20)
CO2: 23 mmol/L (ref 22–32)
Calcium: 9.2 mg/dL (ref 8.9–10.3)
Chloride: 105 mmol/L (ref 98–111)
Creatinine, Ser: 0.74 mg/dL (ref 0.61–1.24)
GFR, Estimated: >60 mL/min (ref >60)
Glucose, Bld: 252 mg/dL — ABNORMAL HIGH (ref 70–99)
Potassium: 3.8 mmol/L (ref 3.5–5.1)
Sodium: 136 mmol/L (ref 135–145)

## 2021-12-11 LAB — URINALYSIS, COMPLETE (UACMP) WITH MICROSCOPIC
Bacteria, UA: NONE SEEN
Bilirubin Urine: NEGATIVE
Glucose, UA: >=500 mg/dL — AB
Hgb urine dipstick: NEGATIVE
Ketones, ur: 5 mg/dL — AB
Leukocytes,Ua: NEGATIVE
Nitrite: NEGATIVE
Protein, ur: 30 mg/dL — AB
Specific Gravity, Urine: 1.028 (ref 1.005–1.030)
Squamous Epithelial / HPF: NONE SEEN (ref 0–5)
pH: 5 (ref 5.0–8.0)

## 2021-12-11 LAB — CBC
HCT: 50.6 % (ref 39.0–52.0)
Hemoglobin: 17.4 g/dL — ABNORMAL HIGH (ref 13.0–17.0)
MCH: 29.3 pg (ref 26.0–34.0)
MCHC: 34.4 g/dL (ref 30.0–36.0)
MCV: 85.3 fL (ref 80.0–100.0)
Platelets: 268 10*3/uL (ref 150–400)
RBC: 5.93 MIL/uL — ABNORMAL HIGH (ref 4.22–5.81)
RDW: 13.5 % (ref 11.5–15.5)
WBC: 17 10*3/uL — ABNORMAL HIGH (ref 4.0–10.5)
nRBC: 0 % (ref 0.0–0.2)

## 2021-12-11 LAB — URINE DRUG SCREEN, QUALITATIVE (ARMC ONLY)
Amphetamines, Ur Screen: NOT DETECTED
Barbiturates, Ur Screen: NOT DETECTED
Benzodiazepine, Ur Scrn: NOT DETECTED
Cannabinoid 50 Ng, Ur ~~LOC~~: NOT DETECTED
Cocaine Metabolite,Ur ~~LOC~~: POSITIVE — AB
MDMA (Ecstasy)Ur Screen: NOT DETECTED
Methadone Scn, Ur: NOT DETECTED
Opiate, Ur Screen: POSITIVE — AB
Phencyclidine (PCP) Ur S: NOT DETECTED
Tricyclic, Ur Screen: POSITIVE — AB

## 2021-12-11 LAB — RESP PANEL BY RT-PCR (FLU A&B, COVID) ARPGX2
Influenza A by PCR: NEGATIVE
Influenza B by PCR: NEGATIVE
SARS Coronavirus 2 by RT PCR: NEGATIVE

## 2021-12-11 LAB — HEPATIC FUNCTION PANEL
ALT: 22 U/L (ref 0–44)
AST: 24 U/L (ref 15–41)
Albumin: 4.1 g/dL (ref 3.5–5.0)
Alkaline Phosphatase: 70 U/L (ref 38–126)
Bilirubin, Direct: 0.1 mg/dL (ref 0.0–0.2)
Indirect Bilirubin: 0.6 mg/dL (ref 0.3–0.9)
Total Bilirubin: 0.7 mg/dL (ref 0.3–1.2)
Total Protein: 7.5 g/dL (ref 6.5–8.1)

## 2021-12-11 LAB — TROPONIN I (HIGH SENSITIVITY)
Troponin I (High Sensitivity): 7 ng/L (ref <18)
Troponin I (High Sensitivity): 7 ng/L (ref <18)

## 2021-12-11 LAB — LACTIC ACID, PLASMA: Lactic Acid, Venous: 1.5 mmol/L (ref 0.5–1.9)

## 2021-12-11 LAB — D-DIMER, QUANTITATIVE: D-Dimer, Quant: 0.78 ug/mL-FEU — ABNORMAL HIGH (ref 0.00–0.50)

## 2021-12-11 LAB — LIPASE, BLOOD: Lipase: 30 U/L (ref 11–51)

## 2021-12-11 LAB — CBG MONITORING, ED: Glucose-Capillary: 147 mg/dL — ABNORMAL HIGH (ref 70–99)

## 2021-12-11 MED ORDER — IOHEXOL 350 MG/ML SOLN
100.0000 mL | Freq: Once | INTRAVENOUS | Status: AC | PRN
  Administered 2021-12-11: 100 mL via INTRAVENOUS

## 2021-12-11 MED ORDER — INSULIN ASPART 100 UNIT/ML IJ SOLN: 0.0000 [IU] | INTRAMUSCULAR | Status: DC

## 2021-12-11 MED ORDER — DOXYCYCLINE HYCLATE 100 MG PO CAPS: 100.0000 mg | ORAL_CAPSULE | Freq: Two times a day (BID) | ORAL | 0 refills | Status: AC

## 2021-12-11 MED ORDER — ASPIRIN 81 MG PO CHEW
324.0000 mg | CHEWABLE_TABLET | Freq: Once | ORAL | Status: AC
Start: 2021-12-11 — End: 2021-12-11
  Administered 2021-12-11: 324 mg via ORAL
  Filled 2021-12-11: qty 4

## 2021-12-11 MED ORDER — LORAZEPAM 0.5 MG PO TABS: 0.5000 mg | ORAL_TABLET | Freq: Once | ORAL | Status: DC

## 2021-12-11 MED ORDER — LACTATED RINGERS IV BOLUS
1000.0000 mL | Freq: Once | INTRAVENOUS | Status: AC
  Administered 2021-12-11: 1000 mL via INTRAVENOUS

## 2021-12-11 MED ORDER — LIDOCAINE 5 % EX PTCH: 1.0000 | MEDICATED_PATCH | Freq: Two times a day (BID) | CUTANEOUS | 0 refills | Status: AC

## 2021-12-11 MED ORDER — GADOBUTROL 1 MMOL/ML IV SOLN
7.5000 mL | Freq: Once | INTRAVENOUS | Status: AC | PRN
  Administered 2021-12-11: 7.5 mL via INTRAVENOUS

## 2021-12-11 MED ORDER — VANCOMYCIN HCL 1500 MG/300ML IV SOLN
1500.0000 mg | Freq: Once | INTRAVENOUS | Status: AC
  Administered 2021-12-11: 1500 mg via INTRAVENOUS
  Filled 2021-12-11: qty 300

## 2021-12-11 MED ORDER — LORAZEPAM 1 MG PO TABS
1.0000 mg | ORAL_TABLET | Freq: Once | ORAL | Status: AC
  Administered 2021-12-11: 1 mg via ORAL
  Filled 2021-12-11: qty 1

## 2021-12-11 MED ORDER — LIDOCAINE 5 % EX PTCH
1.0000 | MEDICATED_PATCH | CUTANEOUS | Status: DC
  Administered 2021-12-11: 1 via TRANSDERMAL
  Filled 2021-12-11: qty 1

## 2021-12-11 MED ORDER — ACETAMINOPHEN 500 MG PO TABS
1000.0000 mg | ORAL_TABLET | Freq: Once | ORAL | Status: AC
Start: 2021-12-11 — End: 2021-12-11
  Administered 2021-12-11: 1000 mg via ORAL
  Filled 2021-12-11: qty 2

## 2021-12-11 MED ORDER — SODIUM CHLORIDE 0.9 % IV SOLN
1.0000 g | Freq: Once | INTRAVENOUS | Status: AC
  Administered 2021-12-11: 1 g via INTRAVENOUS
  Filled 2021-12-11: qty 10

## 2021-12-11 NOTE — ED Notes (Signed)
Pt still sleeping...

## 2021-12-11 NOTE — Progress Notes (Signed)
PHARMACY -  BRIEF ANTIBIOTIC NOTE  ? ?Pharmacy has received consult(s) for vancomycin from an ED provider.  The patient's profile has been reviewed for ht/wt/allergies/indication/available labs.   ? ?One time order(s) placed for vancomycin 1500 mg IV x 1 ? ?Further antibiotics/pharmacy consults should be ordered by admitting physician if indicated.       ?                ?Thank you, ?Dallie Piles ?12/11/2021  7:46 AM ? ?
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
## 2021-12-11 NOTE — ED Provider Notes (Signed)
Patient received in signout from Dr. Tamala Julian pending MRI and repeat troponin.  MRI without any findings suggest epidural abscess or discitis.  Does have evidence of radiculopathy.  His troponins are negative.  Discussed findings with patient.  Do not feel requires hospitalization at this point.  Does appear appropriate for outpatient follow-up.  Discussed conservative management hesitant to prescribe prednisone as he is diabetic.  Also hesitant to prescribe narcotic medications as there is reportedly some concern for substance abuse.  He appear comfortable.  We discussed strict return precautions. ?  ?Merlyn Lot, MD ?12/11/21 1117 ? ?

## 2021-12-11 NOTE — ED Notes (Signed)
Pt asleep, snoring. ?

## 2021-12-11 NOTE — ED Provider Notes (Signed)
? ?[facility] ?Provider Note ? ? ? Event Date/Time  ? First MD Initiated Contact with Patient 12/11/21 [phone]   ?  (approximate) ? ? ?History  ? ?Chest Pain ? ? ?HPI ? ?Chris Norris is a 61 y.o. male   with Past medical history of CAD stable multiple stents in the past, HTN, HLD, IDDM T2, anxiety, cervical fusion and recent admission 3/15 to 3/18 for evaluation of syncope and chest pain having undergone dissection study that was negative for evidence of dissection as well as cardiac work-up including catheterization without any new stents placed discharge with plan for medical therapy who presents for evaluation of several complaints.  Patient states that when he was discharged he got into his car and was on his way home when he started feeling nauseous and vomited and pulled over the side of the road.  He states he then attempted to walk back to the hospital because he could not feel like he could drive and that is having arrived back to the emergency room.  He is not sure if he fell or passed out.  He did not take any medications after leaving the hospital including any alcohol or illegal drugs.  He is primarily complaining of pain in his neck and is not sure if this is any different than it was before he left the hospital or not.  States he also has some crampy generalized abdominal pain and some chest discomfort.  He says his chest pain is better now he is resting but is not completely gone and that it is pain that he has had before and usually is worsened by exertion and alleviated by rest.  Denies any diarrhea, urinary symptoms, cough, fever, shortness of breath or other clear associated sick symptoms.  When asked what is bothering the most he points to his neck with some radiation to the right upper extremity. ? ?  ? ? ?Physical Exam  ?Triage Vital Signs: ?ED Triage Vitals  ?Enc Vitals Group  ?   BP 12/11/21 0359 123/77  ?   Pulse Rate 12/11/21 0359 (!) 105  ?   Resp 12/11/21 0359 14  ?    Temp 12/11/21 0359 98.5 ?F (36.9 ?C)  ?   Temp Source 12/11/21 0359 Oral  ?   SpO2 12/11/21 0359 94 %  ?   Weight 12/11/21 0407 179 lb 0.2 oz (81.2 kg)  ?   Height 12/11/21 0407 '5\' 9"'$  (1.753 m)  ?   Head Circumference --   ?   Peak Flow --   ?   Pain Score 12/11/21 0407 6  ?   Pain Loc --   ?   Pain Edu? --   ?   Excl. in [place]? --   ? ? ?Most recent vital signs: ?Vitals:  ? 12/11/21 0702 12/11/21 0717  ?BP:  108/73  ?Pulse: 71 70  ?Resp: (!) 21 (!) 21  ?Temp:    ?SpO2: 97% 97%  ? ? ?General: Awake, no distress.  ?CV:  Good peripheral perfusion.  2+ radial pulses. ?Resp:  Normal effort.  Clear bilaterally ?Abd:  No distention.  Mildly tender throughout but soft with no rigidity or guarding. ?Other:  Some tenderness over the right trapezius muscles without any midline C-spine tenderness or other lower spinal tenderness.  Patient is able to move all extremities on command.  Sensation is intact to light touch throughout.  Cranial nerves II through XII appear grossly intact. ? ? ?ED Results /  Procedures / Treatments  ?Labs ?(all labs ordered are listed, but only abnormal results are displayed) ?Labs Reviewed  ?BASIC METABOLIC PANEL - Abnormal; Notable for the following components:  ?    Result Value  ? Glucose, Bld 252 (*)   ? All other components within normal limits  ?CBC - Abnormal; Notable for the following components:  ? WBC 17.0 (*)   ? RBC 5.93 (*)   ? Hemoglobin 17.4 (*)   ? All other components within normal limits  ?URINALYSIS, COMPLETE (UACMP) WITH MICROSCOPIC - Abnormal; Notable for the following components:  ? Color, Urine YELLOW (*)   ? APPearance HAZY (*)   ? Glucose, UA >=500 (*)   ? Ketones, ur 5 (*)   ? Protein, ur 30 (*)   ? All other components within normal limits  ?D-DIMER, QUANTITATIVE - Abnormal; Notable for the following components:  ? D-Dimer, Quant 0.78 (*)   ? All other components within normal limits  ?URINE DRUG SCREEN, QUALITATIVE ([facility] ONLY) - Abnormal; Notable for the following  components:  ? Tricyclic, Ur Screen POSITIVE (*)   ? Cocaine Metabolite,Ur [place] POSITIVE (*)   ? Opiate, Ur Screen POSITIVE (*)   ? All other components within normal limits  ?CBG MONITORING, ED - Abnormal; Notable for the following components:  ? Glucose-Capillary 147 (*)   ? All other components within normal limits  ?RESP PANEL BY RT-PCR (FLU A&B, COVID) ARPGX2  ?CULTURE, BLOOD (ROUTINE X 2)  ?CULTURE, BLOOD (ROUTINE X 2)  ?HEPATIC FUNCTION PANEL  ?LIPASE, BLOOD  ?LACTIC ACID, PLASMA  ?TROPONIN I (HIGH SENSITIVITY)  ?TROPONIN I (HIGH SENSITIVITY)  ? ? ? ?EKG ? ?EKG shows sinus tachycardia with a ventricular rate of 105, normal axis, unremarkable intervals with some nonspecific ST changes in lead I and aVL new from EKG from 3/15 without other clear evidence of acute ischemia or significant arrhythmia. ? ? ?RADIOLOGY ? ?Chest x-ray on my interpretation shows no focal consolidation, effusion, edema, pneumothorax or other clear acute process.  I also reviewed radiology interpretation and agree with their findings of some atelectasis at the bases without any other clear acute process. ? ?CT head on my interpretation without evidence of skull fracture, hemorrhage, ischemia or other acute intracranial process.  Also reviewed radiologist interpretation and agree with the findings of same. ? ?CT C-spine my interpretation shows prior corpectomy without acute process.  Also reviewed radiology interpretation and agree with the findings of previous C5-C6 corpectomy with some chronic loosening of the anterior fusion hardware at C4 and C7 with some chronic pseudoarthrosis but otherwise no acute process. ? ?CTA chest PE study on my interpretation remarkable for no PE but some groundglass opacities.  No pneumothorax, effusion edema or other acute process..  I also reviewed radiology interpretation and agree with their findings including CAD, aortic atherosclerosis. ? ?CT abdomen pelvis on my interpretation unremarkable SBO,  diverticulitis, pancreatitis or other clear acute process.  I also reviewed radiologist interpretation and agree with their findings of no acute process in the abdomen pelvis as well as notation of right-sided kidney stone, sigmoid diverticulosis and aortic atherosclerosis. ? ?PROCEDURES: ? ?Critical Care performed: No ? ?.1-3 Lead EKG Interpretation ?Performed by: Lucrezia Starch, MD ?Authorized by: Lucrezia Starch, MD  ? ?  Interpretation: normal   ?  ECG rate assessment: normal   ?  Rhythm: sinus rhythm   ?  Ectopy: none   ?  Conduction: normal   ? ?The patient is on the  cardiac monitor to evaluate for evidence of arrhythmia and/or significant heart rate changes. ? ? ?MEDICATIONS ORDERED IN ED: ?Medications  ?lidocaine (LIDODERM) 5 % 1 patch (1 patch Transdermal Patch Applied 12/11/21 0452)  ?insulin aspart (novoLOG) injection 0-15 Units (has no administration in time range)  ?cefTRIAXone (ROCEPHIN) 1 g in sodium chloride 0.9 % 100 mL IVPB (has no administration in time range)  ?acetaminophen (TYLENOL) tablet 1,000 mg (1,000 mg Oral Given 12/11/21 0455)  ?lactated ringers bolus 1,000 mL (0 mLs Intravenous Stopped 12/11/21 0600)  ?aspirin chewable tablet 324 mg (324 mg Oral Given 12/11/21 0454)  ?iohexol (OMNIPAQUE) 350 MG/ML injection 100 mL (100 mLs Intravenous Contrast Given 12/11/21 0548)  ? ? ? ?IMPRESSION / MDM / ASSESSMENT AND PLAN / ED COURSE  ?I reviewed the triage vital signs and the nursing notes. ?             ?               ? ?Differential diagnosis includes, but is not limited to ACS, PE, pneumonia, bronchitis, pneumothorax, metabolic derangements, arrhythmia and possible head or neck injury given patient reports he is not sure if he passed out and fell hitting his head or not.  No obvious trauma on exam and is otherwise neurovascular intact. ? ?BMP shows a glucose of 252 without any other significant electrolyte or metabolic derangements.  CBC shows WBC count of 17,000 without evidence of acute anemia  and normal platelets.  Hepatic function panel is unremarkable.  Lipase not consistent with acute pancreatitis.  COVID influenza PCR is negative.  D-dimer elevated 0.78.  UA has some glucose ketones and protein but n

## 2021-12-11 NOTE — ED Notes (Signed)
VS overdue because pt was taken to MRI. Vanc was paused by MRI tech. ?

## 2021-12-11 NOTE — ED Notes (Signed)
Vanc still infusing, was paused by MRI now going again. Pt given water, pt asking for food. ?

## 2021-12-12 ENCOUNTER — Encounter: Payer: Self-pay | Admitting: Internal Medicine

## 2021-12-16 LAB — CULTURE, BLOOD (ROUTINE X 2)
Culture: NO GROWTH
Culture: NO GROWTH
Special Requests: ADEQUATE
Special Requests: ADEQUATE

## 2022-05-02 ENCOUNTER — Other Ambulatory Visit: Payer: Self-pay

## 2022-05-02 ENCOUNTER — Emergency Department: Payer: BC Managed Care – PPO

## 2022-05-02 ENCOUNTER — Emergency Department
Admission: EM | Admit: 2022-05-02 | Discharge: 2022-05-02 | Discharge status: Against Medical Advice | Payer: BC Managed Care – PPO | Attending: Emergency Medicine | Admitting: Emergency Medicine

## 2022-05-02 DIAGNOSIS — Z5321 Procedure and treatment not carried out due to patient leaving prior to being seen by health care provider: Secondary | ICD-10-CM | POA: Insufficient documentation

## 2022-05-02 DIAGNOSIS — R079 Chest pain, unspecified: Secondary | ICD-10-CM | POA: Diagnosis present

## 2022-05-02 DIAGNOSIS — E119 Type 2 diabetes mellitus without complications: Secondary | ICD-10-CM | POA: Diagnosis not present

## 2022-05-02 DIAGNOSIS — Z955 Presence of coronary angioplasty implant and graft: Secondary | ICD-10-CM | POA: Insufficient documentation

## 2022-05-02 LAB — BASIC METABOLIC PANEL
Anion gap: 6 (ref 5–15)
BUN: 15 mg/dL (ref 6–20)
CO2: 23 mmol/L (ref 22–32)
Calcium: 9 mg/dL (ref 8.9–10.3)
Chloride: 110 mmol/L (ref 98–111)
Creatinine, Ser: 0.6 mg/dL — ABNORMAL LOW (ref 0.61–1.24)
GFR, Estimated: >60 mL/min (ref >60)
Glucose, Bld: 139 mg/dL — ABNORMAL HIGH (ref 70–99)
Potassium: 3.6 mmol/L (ref 3.5–5.1)
Sodium: 139 mmol/L (ref 135–145)

## 2022-05-02 LAB — CBC
HCT: 46.3 % (ref 39.0–52.0)
Hemoglobin: 15.8 g/dL (ref 13.0–17.0)
MCH: 30.6 pg (ref 26.0–34.0)
MCHC: 34.1 g/dL (ref 30.0–36.0)
MCV: 89.6 fL (ref 80.0–100.0)
Platelets: 281 10*3/uL (ref 150–400)
RBC: 5.17 MIL/uL (ref 4.22–5.81)
RDW: 13.2 % (ref 11.5–15.5)
WBC: 7.5 10*3/uL (ref 4.0–10.5)
nRBC: 0 % (ref 0.0–0.2)

## 2022-05-02 LAB — TROPONIN I (HIGH SENSITIVITY): Troponin I (High Sensitivity): 4 ng/L (ref <18)

## 2022-05-02 NOTE — ED Provider Triage Note (Signed)
Emergency Medicine Provider Triage Evaluation Note  Chris Norris, a 61 y.o. male  was evaluated in triage.  Pt complains of chest pain with onset last night.  Patient with history of MI, diabetes, and stent x 6 presents to the ED for evaluation of ongoing intermittent chest pain.  Patient took 4 doses of nitro last night with limited benefit.  He reports some nausea but denies any vomiting.  Review of Systems  Positive: CP Negative: FCS  Physical Exam  BP 124/71   Pulse 84   Temp 98.4 F (36.9 C) (Oral)   Resp 17   Ht '5\' 10"'$  (1.778 m)   Wt 81.6 kg   SpO2 94%   BMI 25.83 kg/m  Gen:   Awake, no distress  NAD Resp:  Normal effort CTA MSK:   Moves extremities without difficulty  Other:    Medical Decision Making  Medically screening exam initiated at 6:29 PM.  Appropriate orders placed.  Chris Norris was informed that the remainder of the evaluation will be completed by another provider, this initial triage assessment does not replace that evaluation, and the importance of remaining in the ED until their evaluation is complete.  Patient with a history of AMI, CAD with stent placement x 2, presents to the ED with onset of intermittent chest pain since last night.   Melvenia Needles, PA-C 05/02/22 1830

## 2022-05-02 NOTE — ED Triage Notes (Signed)
Pt began having chest pain last night. Pt went to PCP and was sent here for cardiac enzymes to be drawn. Pt has a heart history.

## 2023-06-29 ENCOUNTER — Ambulatory Visit
Admission: EM | Admit: 2023-06-29 | Discharge: 2023-06-29 | Disposition: A | Discharge status: Home / Self Care | Payer: BC Managed Care – PPO | Attending: Emergency Medicine | Admitting: Emergency Medicine

## 2023-06-29 DIAGNOSIS — A059 Bacterial foodborne intoxication, unspecified: Secondary | ICD-10-CM | POA: Insufficient documentation

## 2023-06-29 DIAGNOSIS — R000 Tachycardia, unspecified: Secondary | ICD-10-CM | POA: Diagnosis not present

## 2023-06-29 LAB — URINALYSIS, W/ REFLEX TO CULTURE (INFECTION SUSPECTED)
Glucose, UA: NEGATIVE mg/dL
Hgb urine dipstick: NEGATIVE
Ketones, ur: 40 mg/dL — AB
Leukocytes,Ua: NEGATIVE
Nitrite: NEGATIVE
Protein, ur: 100 mg/dL — AB
RBC / HPF: NONE SEEN RBC/hpf (ref 0–5)
Specific Gravity, Urine: >1.03 — ABNORMAL HIGH (ref 1.005–1.030)
Squamous Epithelial / HPF: NONE SEEN /[HPF] (ref 0–5)
pH: 5.5 (ref 5.0–8.0)

## 2023-06-29 LAB — CBC WITH DIFFERENTIAL/PLATELET
Abs Immature Granulocytes: 0.02 10*3/uL (ref 0.00–0.07)
Basophils Absolute: 0 10*3/uL (ref 0.0–0.1)
Basophils Relative: 0 %
Eosinophils Absolute: 0.1 10*3/uL (ref 0.0–0.5)
Eosinophils Relative: 2 %
HCT: 56.4 % — ABNORMAL HIGH (ref 39.0–52.0)
Hemoglobin: 19.7 g/dL — ABNORMAL HIGH (ref 13.0–17.0)
Immature Granulocytes: 0 %
Lymphocytes Relative: 21 %
Lymphs Abs: 1.4 10*3/uL (ref 0.7–4.0)
MCH: 31.4 pg (ref 26.0–34.0)
MCHC: 34.9 g/dL (ref 30.0–36.0)
MCV: 89.8 fL (ref 80.0–100.0)
Monocytes Absolute: 0.8 10*3/uL (ref 0.1–1.0)
Monocytes Relative: 13 %
Neutro Abs: 4.2 10*3/uL (ref 1.7–7.7)
Neutrophils Relative %: 64 %
Platelets: 339 10*3/uL (ref 150–400)
RBC: 6.28 MIL/uL — ABNORMAL HIGH (ref 4.22–5.81)
RDW: 13.2 % (ref 11.5–15.5)
WBC: 6.5 10*3/uL (ref 4.0–10.5)
nRBC: 0 % (ref 0.0–0.2)

## 2023-06-29 LAB — COMPREHENSIVE METABOLIC PANEL
ALT: 30 U/L (ref 0–44)
AST: 21 U/L (ref 15–41)
Albumin: 4.2 g/dL (ref 3.5–5.0)
Alkaline Phosphatase: 96 U/L (ref 38–126)
Anion gap: 13 (ref 5–15)
BUN: 22 mg/dL — ABNORMAL HIGH (ref 6–20)
CO2: 19 mmol/L — ABNORMAL LOW (ref 22–32)
Calcium: 8.8 mg/dL — ABNORMAL LOW (ref 8.9–10.3)
Chloride: 102 mmol/L (ref 98–111)
Creatinine, Ser: 1.06 mg/dL (ref 0.61–1.24)
GFR, Estimated: >60 mL/min (ref >60)
Glucose, Bld: 228 mg/dL — ABNORMAL HIGH (ref 70–99)
Potassium: 3.6 mmol/L (ref 3.5–5.1)
Sodium: 134 mmol/L — ABNORMAL LOW (ref 135–145)
Total Bilirubin: 1.2 mg/dL (ref 0.3–1.2)
Total Protein: 8 g/dL (ref 6.5–8.1)

## 2023-06-29 MED ORDER — ONDANSETRON 8 MG PO TBDP: 8.0000 mg | ORAL_TABLET | Freq: Three times a day (TID) | ORAL | 0 refills | Status: DC | PRN

## 2023-06-29 MED ORDER — DICYCLOMINE HCL 20 MG PO TABS: 20.0000 mg | ORAL_TABLET | Freq: Two times a day (BID) | ORAL | 0 refills | Status: AC

## 2023-06-29 MED ORDER — ONDANSETRON 8 MG PO TBDP
8.0000 mg | ORAL_TABLET | Freq: Once | ORAL | Status: AC
  Administered 2023-06-29: 8 mg via ORAL

## 2023-06-29 NOTE — ED Provider Notes (Signed)
[facility]    CSN: [id number] Arrival date & time: 06/29/23  1224      History   Chief Complaint Chief Complaint  Patient presents with   Vomiting   Diarrhea   Nausea    HPI Chris Norris is a 62 y.o. male.   HPI  62 year old male with past medical history significant for MI with stent placement x 3, type 2 diabetes, sleep apnea, and CAD presents for evaluation of 4 days worth of GI symptoms.  He reports that he ate a prepackaged meal that he left in the refrigerator for over a week and then began having nausea, vomiting, and diarrhea.  He reports that he has been trying to replace fluids by mouth with minimal success.  He has not been able to eat any solid food.  He continues to have nausea and approximately 4-5 episodes of vomiting daily.  Diarrhea is constant.  He denies any blood in either his stool or vomit.  He also denies any syncope or chest pain.  No fever.  Past Medical History:  Diagnosis Date   Coronary artery disease    Diabetes mellitus without complication (HCC)    Heart disease    Myocardial infarction (HCC)    X 3 STENTS LAST 10 YEARS AGO   Sleep apnea in adult    Type 2 diabetes mellitus without complication, with long-term current use of insulin (HCC) 07/10/2019    Patient Active Problem List   Diagnosis Date Noted   Chest pain 12/07/2021   HLD (hyperlipidemia) 07/10/2019   HTN (hypertension) 07/10/2019   S/P angioplasty with stent, 2 stents in RCA and 1 DES in mLAD 07/09/19  07/10/2019   Type 2 diabetes mellitus without complication, with long-term current use of insulin (HCC) 07/10/2019   Unstable angina (HCC) 07/09/2019   Chest pain with high risk of acute coronary syndrome 07/07/2019   Secondary polycythemia 12/28/2017   Polycythemia, secondary 02/15/2017   Anxiety 08/11/2014   Coronary artery disease 08/11/2014    Past Surgical History:  Procedure Laterality Date   BICEPS TENDON REPAIR     BICEPS TENDON REPAIR Right 11/28/2018    Procedure: OPEN SUBPECTORAL REPAIR OF THE LONG HEAD OF BICEP TENDON-RIGHT SHOULDER;  Surgeon: Christena Flake, MD;  Location: [facility] ORS;  Service: Orthopedics;  Laterality: Right;   CORONARY ANGIOPLASTY     X 3   CORONARY STENT INTERVENTION N/A 07/09/2019   Procedure: CORONARY STENT INTERVENTION;  Surgeon: Iran Ouch, MD;  Location: [facility];  Service: Cardiovascular;  Laterality: N/A;  Distal RCA, Prox RCA, 1st Diag   KNEE ARTHROSCOPY W/ MENISCAL REPAIR     LEFT HEART CATH AND CORONARY ANGIOGRAPHY N/A 07/07/2019   Procedure: LEFT HEART CATH AND CORONARY ANGIOGRAPHY with cors and possible pci and stent;  Surgeon: Alwyn Pea, MD;  Location: [facility];  Service: Cardiovascular;  Laterality: N/A;   LEFT HEART CATH AND CORONARY ANGIOGRAPHY N/A 12/09/2021   Procedure: LEFT HEART CATH AND CORONARY ANGIOGRAPHY;  Surgeon: Lamar Blinks, MD;  Location: [facility];  Service: Cardiovascular;  Laterality: N/A;   STENT PLACEMENT VASCULAR ([facility] HX)     X 3 LAST 10 YEARS AGO   TRICEPS TENDON REPAIR         Home Medications    Prior to Admission medications   Medication Sig Start Date End Date Taking? Authorizing Provider  acetaminophen (TYLENOL) 325 MG tablet Take 2 tablets (650 mg total) by  mouth every 4 (four) hours as needed for headache or mild pain. 07/10/19  Yes Leone Brand, NP  aspirin EC 81 MG tablet Take 1 tablet (81 mg total) by mouth daily. 07/10/19  Yes Leone Brand, NP  clopidogrel (PLAVIX) 75 MG tablet TAKE 1 TABLET (75 MG TOTAL) BY MOUTH DAILY WITH BREAKFAST. 09/01/19  Yes Leone Brand, NP  Diclofenac Sodium CR 100 MG 24 hr tablet Take 100 mg by mouth daily as needed. 11/19/21  Yes [provider]  dicyclomine (BENTYL) 20 MG tablet Take 1 tablet (20 mg total) by mouth 2 (two) times daily. 06/29/23  Yes Becky Augusta, NP  insulin aspart (FIASP FLEXTOUCH) 100 UNIT/ML FlexTouch Pen INJECTABLE FOLLOW SAME DIRECTIONS GIVEN ON  NOVOLOG. 08/15/19  Yes [provider]  nitroGLYCERIN (NITROSTAT) 0.4 MG SL tablet Place 1 tablet (0.4 mg total) under the tongue every 5 (five) minutes as needed for chest pain. 07/10/19  Yes Leone Brand, NP  ondansetron (ZOFRAN-ODT) 8 MG disintegrating tablet Take 1 tablet (8 mg total) by mouth every 8 (eight) hours as needed for nausea or vomiting. 06/29/23  Yes Becky Augusta, NP  REPATHA SURECLICK 140 MG/ML SOAJ Inject 140 mg into the skin every 14 (fourteen) days. 11/27/21  Yes [provider]  TOUJEO SOLOSTAR 300 UNIT/ML Solostar Pen Inject 10 Units into the skin 2 (two) times daily. 07/23/21  Yes [provider]  gabapentin (NEURONTIN) 100 MG capsule Take 2 capsules (200 mg total) by mouth 3 (three) times daily. 12/10/21 01/09/22  Gillis Santa, MD  isosorbide mononitrate (IMDUR) 30 MG 24 hr tablet Take 1 tablet (30 mg total) by mouth daily. 12/11/21 03/11/22  Gillis Santa, MD  metoprolol succinate (TOPROL-XL) 25 MG 24 hr tablet Take 1 tablet (25 mg total) by mouth daily. 12/11/21 03/11/22  Gillis Santa, MD  traZODone (DESYREL) 50 MG tablet Take 1 tablet (50 mg total) by mouth at bedtime as needed for sleep. 12/10/21 01/09/22  Gillis Santa, MD    Family History History reviewed. No pertinent family history.  Social History Social History   Tobacco Use   Smoking status: Never   Smokeless tobacco: Never   Tobacco comments:    occasional cigars  Vaping Use   Vaping status: Never Used  Substance Use Topics   Alcohol use: Not Currently   Drug use: Never     Allergies   Patient has no known allergies.   Review of Systems Review of Systems  Gastrointestinal:  Positive for abdominal pain, diarrhea, nausea and vomiting. Negative for blood in stool.  Neurological:  Positive for dizziness.     Physical Exam Triage Vital Signs ED Triage Vitals  Encounter Vitals Group     BP      Systolic BP Percentile      Diastolic BP Percentile      Pulse      Resp       Temp      Temp src      SpO2      Weight      Height      Head Circumference      Peak Flow      Pain Score      Pain Loc      Pain Education      Exclude from Growth Chart    Orthostatic VS for the past 24 hrs:  BP- Lying Pulse- Lying BP- Sitting Pulse- Sitting BP- Standing at 0 minutes Pulse- Standing at 0 minutes  06/29/23 1345 115/88 98 (!) 109/96 115 (!) 107/95 121    Updated Vital Signs BP (!) 111/92 (BP Location: Left Arm)   Pulse (!) 113   Temp 98.1 F (36.7 C) (Oral)   Ht 5\' 9"  (1.753 m)   Wt 195 lb (88.5 kg)   SpO2 97%   BMI 28.80 kg/m   Visual Acuity Right Eye Distance:   Left Eye Distance:   Bilateral Distance:    Right Eye Near:   Left Eye Near:    Bilateral Near:     Physical Exam Vitals and nursing note reviewed.  Constitutional:      Appearance: Normal appearance. He is not ill-appearing.  HENT:     Head: Normocephalic and atraumatic.     Mouth/Throat:     Mouth: Mucous membranes are dry.     Pharynx: Oropharynx is clear. No oropharyngeal exudate or posterior oropharyngeal erythema.  Cardiovascular:     Rate and Rhythm: Normal rate and regular rhythm.     Pulses: Normal pulses.     Heart sounds: Normal heart sounds. No murmur heard.    No friction rub. No gallop.  Pulmonary:     Effort: Pulmonary effort is normal.     Breath sounds: Normal breath sounds. No wheezing, rhonchi or rales.  Abdominal:     General: Abdomen is flat.     Palpations: Abdomen is soft.     Tenderness: There is abdominal tenderness. There is no guarding or rebound.  Skin:    General: Skin is warm and dry.     Capillary Refill: Capillary refill takes less than 2 seconds.  Neurological:     General: No focal deficit present.     Mental Status: He is alert and oriented to person, place, and time.      UC Treatments / Results  Labs (all labs ordered are listed, but only abnormal results are displayed) Labs Reviewed  CBC WITH DIFFERENTIAL/PLATELET -  Abnormal; Notable for the following components:      Result Value   RBC 6.28 (*)    Hemoglobin 19.7 (*)    HCT 56.4 (*)    All other components within normal limits  COMPREHENSIVE METABOLIC PANEL - Abnormal; Notable for the following components:   Sodium 134 (*)    CO2 19 (*)    Glucose, Bld 228 (*)    BUN 22 (*)    Calcium 8.8 (*)    All other components within normal limits  URINALYSIS, W/ REFLEX TO CULTURE (INFECTION SUSPECTED) - Abnormal; Notable for the following components:   Color, Urine AMBER (*)    Specific Gravity, Urine >1.030 (*)    Bilirubin Urine MODERATE (*)    Ketones, ur 40 (*)    Protein, ur 100 (*)    Bacteria, UA RARE (*)    All other components within normal limits    EKG Sinus tachycardia with a ventricular rate of 105 bpm PR interval 140 ms QRS duration 94 ms QT/QTc 360/475 ms Nonspecific ST abnormality.  Radiology No results found.  Procedures Procedures (including critical care time)  Medications Ordered in UC Medications  ondansetron (ZOFRAN-ODT) disintegrating tablet 8 mg (8 mg Oral Given 06/29/23 1338)    Initial Impression / Assessment and Plan / UC Course  I have reviewed the triage vital signs and the nursing notes.  Pertinent labs & imaging results that were available during my care of the patient were reviewed by me and considered in my medical decision making (see chart  for details).   Patient is a nontoxic-appearing 62 year old male presenting for evaluation of 4 days worth of GI symptoms as outlined HPI above.  The patient symptoms began after he ate a refrigerated meal that was not frozen and that he had left in the refrigerator for over a week.  After he ate the meal his symptoms developed.  He reports that he has been trying to take small amounts of p.o. fluid but this causes vomiting.  He has not been able to eat at all.  He states that he has had some intermittent dizziness, mostly with change in position but he has not had any  syncope.  On exam his skin turgor is normal but his oral mucous membranes are dry and sticky.  Cardiopulmonary exam is benign.  Abdomen is soft, flat, with generalized tenderness but no guarding or rebound.  I suspect that the patient has food poisoning and there is also concern for electrolyte imbalance given his vomiting and diarrhea without being able to replace fluids by mouth.  I will check CBC, CMP, UA, orthostatic vital signs, and EKG.  I have also ordered a milligrams of Zofran to be administered ODT.  We will attempt a p.o. fluid challenge 20 minutes after Zofran ministration to see if the patient is able to drink fluids and keep them down.  EKG shows sinus tachycardia with nonspecific ST abnormality.  There is a flattening of the ST segment in lead V1.  No significant change when compared to EKG from 05/02/2022.  Orthostatic vital signs demonstrate an 8 point drop in systolic blood pressure between lying and standing.  There is a 23 beat increase in heart rate between sitting and standing.  CMP shows mild hyponatremia with a sodium of 134.  Potassium is normal at 3.6 as is chloride at 102.  CO2 is mildly decreased at 19, glucose is elevated to 28, BUN is up at 22.  Creatinine is normal at 1.06.  Transaminases are unremarkable.  Urinalysis shows high specific gravity of >1.030 with moderate bilirubin, 40 ketones, and 100 protein.  No glucose, hemoglobin, leukocyte esterase, or nitrites.  CBC shows normal white count.  There are elevations to total RBC count of 6.28, H&H of 19.7 and 56.4 respectively.  Platelets normal at 339.  No abnormalities to the differential.  I suspect that this is secondary to hemoconcentration.  Patient has passed the p.o. challenge.  I will discharge him home with a diagnosis of food poisoning with a prescription for Zofran and Bentyl and have him continue oral rehydration.  If he is unable to keep down fluids or medications he needs to go to the ER for  reevaluation.  Final Clinical Impressions(s) / UC Diagnoses   Final diagnoses:  Food poisoning     Discharge Instructions      Take the Zofran every 8 hours as needed for nausea and vomiting.  They are an oral disintegrating tablet and you can place them on her under your tongue and then will be absorbed.  Use the Bentyl (dicyclomine) every 6 hours as needed for abdominal cramping.  Follow a clear liquid diet for the next 6 to 12 hours.  Clear liquids consist of broth, ginger ale, water, Pedialyte, and Jell-O.  After 6 to 12 hours, if you are tolerating clear liquids, you can advance to bland foods such as bananas, rice, applesauce, and toast.  If you tolerate bland foods you can continue to advance your diet as you see fit.  If you  develop a fever over 100.5, increased abdominal pain, bloody vomit, or bloody stool return for reevaluation or go to the ER.      ED Prescriptions     Medication Sig Dispense Auth. Provider   dicyclomine (BENTYL) 20 MG tablet Take 1 tablet (20 mg total) by mouth 2 (two) times daily. 20 tablet Becky Augusta, NP   ondansetron (ZOFRAN-ODT) 8 MG disintegrating tablet Take 1 tablet (8 mg total) by mouth every 8 (eight) hours as needed for nausea or vomiting. 20 tablet Becky Augusta, NP      PDMP not reviewed this encounter.   Becky Augusta, NP 06/29/23 1424

## 2023-06-29 NOTE — Discharge Instructions (Addendum)
Take the Zofran every 8 hours as needed for nausea and vomiting.  They are an oral disintegrating tablet and you can place them on her under your tongue and then will be absorbed.  Use the Bentyl (dicyclomine) every 6 hours as needed for abdominal cramping.  Follow a clear liquid diet for the next 6 to 12 hours.  Clear liquids consist of broth, ginger ale, water, Pedialyte, and Jell-O.  After 6 to 12 hours, if you are tolerating clear liquids, you can advance to bland foods such as bananas, rice, applesauce, and toast.  If you tolerate bland foods you can continue to advance your diet as you see fit.  If you develop a fever over 100.5, increased abdominal pain, bloody vomit, or bloody stool return for reevaluation or go to the ER.  

## 2023-06-29 NOTE — ED Triage Notes (Signed)
Pt c/o possible food poisoning x4days  Pt states that he ate a prepackaged meal that was not frozen and left in his fridge. Pt states that since he ate the meal he has been having diarrhea and vomiting.   Pt states he is having nausea, dizziness and pain in his upper chest.   Pt states that he vomited 4 times this morning around 4am.  Pt states that his vomit is brown and green in color.   Pt has lower back surgery planned for 07/09/23

## 2023-07-03 ENCOUNTER — Emergency Department: Payer: BC Managed Care – PPO

## 2023-07-03 ENCOUNTER — Other Ambulatory Visit: Payer: Self-pay

## 2023-07-03 ENCOUNTER — Emergency Department
Admission: EM | Admit: 2023-07-03 | Discharge: 2023-07-03 | Disposition: A | Discharge status: Home / Self Care | Payer: BC Managed Care – PPO | Attending: Emergency Medicine | Admitting: Emergency Medicine

## 2023-07-03 DIAGNOSIS — E119 Type 2 diabetes mellitus without complications: Secondary | ICD-10-CM | POA: Insufficient documentation

## 2023-07-03 DIAGNOSIS — K529 Noninfective gastroenteritis and colitis, unspecified: Secondary | ICD-10-CM | POA: Insufficient documentation

## 2023-07-03 DIAGNOSIS — R112 Nausea with vomiting, unspecified: Secondary | ICD-10-CM | POA: Diagnosis present

## 2023-07-03 DIAGNOSIS — R079 Chest pain, unspecified: Secondary | ICD-10-CM

## 2023-07-03 DIAGNOSIS — I10 Essential (primary) hypertension: Secondary | ICD-10-CM | POA: Diagnosis not present

## 2023-07-03 DIAGNOSIS — R1013 Epigastric pain: Secondary | ICD-10-CM

## 2023-07-03 DIAGNOSIS — I2510 Atherosclerotic heart disease of native coronary artery without angina pectoris: Secondary | ICD-10-CM | POA: Diagnosis not present

## 2023-07-03 DIAGNOSIS — R531 Weakness: Secondary | ICD-10-CM | POA: Insufficient documentation

## 2023-07-03 LAB — CBC
HCT: 54.7 % — ABNORMAL HIGH (ref 39.0–52.0)
Hemoglobin: 19.6 g/dL — ABNORMAL HIGH (ref 13.0–17.0)
MCH: 31.5 pg (ref 26.0–34.0)
MCHC: 35.8 g/dL (ref 30.0–36.0)
MCV: 87.9 fL (ref 80.0–100.0)
Platelets: 327 10*3/uL (ref 150–400)
RBC: 6.22 MIL/uL — ABNORMAL HIGH (ref 4.22–5.81)
RDW: 12.6 % (ref 11.5–15.5)
WBC: 13.9 10*3/uL — ABNORMAL HIGH (ref 4.0–10.5)
nRBC: 0 % (ref 0.0–0.2)

## 2023-07-03 LAB — BASIC METABOLIC PANEL
Anion gap: 12 (ref 5–15)
BUN: 16 mg/dL (ref 6–20)
CO2: 23 mmol/L (ref 22–32)
Calcium: 9 mg/dL (ref 8.9–10.3)
Chloride: 98 mmol/L (ref 98–111)
Creatinine, Ser: 0.96 mg/dL (ref 0.61–1.24)
GFR, Estimated: >60 mL/min (ref >60)
Glucose, Bld: 225 mg/dL — ABNORMAL HIGH (ref 70–99)
Potassium: 3.9 mmol/L (ref 3.5–5.1)
Sodium: 133 mmol/L — ABNORMAL LOW (ref 135–145)

## 2023-07-03 LAB — TROPONIN I (HIGH SENSITIVITY): Troponin I (High Sensitivity): 7 ng/L (ref <18)

## 2023-07-03 LAB — HEPATIC FUNCTION PANEL
ALT: 39 U/L (ref 0–44)
AST: 26 U/L (ref 15–41)
Albumin: 4 g/dL (ref 3.5–5.0)
Alkaline Phosphatase: 99 U/L (ref 38–126)
Bilirubin, Direct: 0.1 mg/dL (ref 0.0–0.2)
Indirect Bilirubin: 0.8 mg/dL (ref 0.3–0.9)
Total Bilirubin: 0.9 mg/dL (ref 0.3–1.2)
Total Protein: 7.4 g/dL (ref 6.5–8.1)

## 2023-07-03 LAB — LIPASE, BLOOD: Lipase: 31 U/L (ref 11–51)

## 2023-07-03 MED ORDER — SODIUM CHLORIDE 0.9 % IV SOLN: 50.0000 mg | Freq: Once | INTRAVENOUS | Status: DC

## 2023-07-03 MED ORDER — ONDANSETRON 4 MG PO TBDP: 4.0000 mg | ORAL_TABLET | Freq: Three times a day (TID) | ORAL | 0 refills | Status: AC | PRN

## 2023-07-03 MED ORDER — PANTOPRAZOLE SODIUM 40 MG PO TBEC
40.0000 mg | DELAYED_RELEASE_TABLET | Freq: Every day | ORAL | 0 refills | Status: AC
Start: 2023-07-03 — End: 2024-08-08

## 2023-07-03 MED ORDER — ALUM & MAG HYDROXIDE-SIMETH 200-200-20 MG/5ML PO SUSP
30.0000 mL | Freq: Once | ORAL | Status: AC
  Administered 2023-07-03: 30 mL via ORAL
  Filled 2023-07-03: qty 30

## 2023-07-03 MED ORDER — ONDANSETRON HCL 4 MG/2ML IJ SOLN
4.0000 mg | Freq: Once | INTRAMUSCULAR | Status: AC
Start: 2023-07-03 — End: 2023-07-03
  Administered 2023-07-03: 4 mg via INTRAVENOUS
  Filled 2023-07-03: qty 2

## 2023-07-03 MED ORDER — LIDOCAINE VISCOUS HCL 2 % MT SOLN
15.0000 mL | Freq: Once | OROMUCOSAL | Status: AC
  Administered 2023-07-03: 15 mL via ORAL
  Filled 2023-07-03: qty 15

## 2023-07-03 MED ORDER — DIPHENHYDRAMINE HCL 50 MG/ML IJ SOLN
INTRAMUSCULAR | Status: AC
  Administered 2023-07-03: 50 mg
  Filled 2023-07-03: qty 1

## 2023-07-03 MED ORDER — LACTATED RINGERS IV BOLUS
1000.0000 mL | Freq: Once | INTRAVENOUS | Status: AC
  Administered 2023-07-03: 1000 mL via INTRAVENOUS

## 2023-07-03 NOTE — ED Notes (Signed)
Redness and itching have resolved. EDP reevaluated pt at bedside.

## 2023-07-03 NOTE — ED Provider Notes (Signed)
[facility] Provider Note    Event Date/Time   First MD Initiated Contact with Patient 07/03/23 1503     (approximate)   History   Chief Complaint Weakness   HPI  Chris Norris is a 62 y.o. male with past medical history of hypertension, hyperlipidemia, diabetes, CAD, and anxiety who presents to the ED complaining of weakness.  Patient reports that he has had about 1 week of nausea, vomiting, and watery diarrhea.  He reports feeling feverish but has not checked his temperature, has not noticed any blood in his emesis or stool.  He does report some epigastric discomfort moving up into his chest, but denies any cough or difficulty breathing.  He has had some numbness and tingling in his left arm at times, denies any weakness in his extremities.     Physical Exam   Triage Vital Signs: ED Triage Vitals  Encounter Vitals Group     BP 07/03/23 1031 119/84     Systolic BP Percentile --      Diastolic BP Percentile --      Pulse Rate 07/03/23 1031 (!) 109     Resp 07/03/23 1031 16     Temp 07/03/23 1031 98.1 F (36.7 C)     Temp Source 07/03/23 1031 Oral     SpO2 07/03/23 1031 93 %     Weight 07/03/23 1029 195 lb 1.7 oz (88.5 kg)     Height 07/03/23 1029 5\' 9"  (1.753 m)     Head Circumference --      Peak Flow --      Pain Score 07/03/23 1029 10     Pain Loc --      Pain Education --      Exclude from Growth Chart --     Most recent vital signs: Vitals:   07/03/23 1515 07/03/23 1600  BP: (!) 140/85 130/85  Pulse: 98 98  Resp: 19 15  Temp:    SpO2: 99% 98%    Constitutional: Alert and oriented. Eyes: Conjunctivae are normal. Head: Atraumatic. Nose: No congestion/rhinnorhea. Mouth/Throat: Mucous membranes are moist.  Cardiovascular: Normal rate, regular rhythm. Grossly normal heart sounds.  2+ radial pulses bilaterally. Respiratory: Normal respiratory effort.  No retractions. Lungs CTAB. Gastrointestinal: Soft and nontender. No  distention. Musculoskeletal: No lower extremity tenderness nor edema.  Neurologic:  Normal speech and language. No gross focal neurologic deficits are appreciated.    ED Results / Procedures / Treatments   Labs (all labs ordered are listed, but only abnormal results are displayed) Labs Reviewed  BASIC METABOLIC PANEL - Abnormal; Notable for the following components:      Result Value   Sodium 133 (*)    Glucose, Bld 225 (*)    All other components within normal limits  CBC - Abnormal; Notable for the following components:   WBC 13.9 (*)    RBC 6.22 (*)    Hemoglobin 19.6 (*)    HCT 54.7 (*)    All other components within normal limits  LIPASE, BLOOD  HEPATIC FUNCTION PANEL  TROPONIN I (HIGH SENSITIVITY)     EKG  ED ECG REPORT I, Chesley Noon, the attending physician, personally viewed and interpreted this ECG.   Date: 07/03/2023  EKG Time: 10:28  Rate: 112  Rhythm: sinus tachycardia  Axis: LAD  Intervals:none  ST&T Change: None  RADIOLOGY CT head reviewed and interpreted by me with no hemorrhage or midline shift.  PROCEDURES:  Critical Care performed: No  Procedures   MEDICATIONS ORDERED IN ED: Medications  lactated ringers bolus 1,000 mL (0 mLs Intravenous Stopped 07/03/23 1700)  ondansetron (ZOFRAN) injection 4 mg (4 mg Intravenous Given 07/03/23 1554)  alum & mag hydroxide-simeth (MAALOX/MYLANTA) 200-200-20 MG/5ML suspension 30 mL (30 mLs Oral Given 07/03/23 1557)    And  lidocaine (XYLOCAINE) 2 % viscous mouth solution 15 mL (15 mLs Oral Given 07/03/23 1558)  diphenhydrAMINE (BENADRYL) 50 MG/ML injection (50 mg  Given 07/03/23 1714)     IMPRESSION / MDM / ASSESSMENT AND PLAN / ED COURSE  I reviewed the triage vital signs and the nursing notes.                              63 y.o. male with past medical history of hypertension, hyperlipidemia, diabetes, CAD, and anxiety who presents to the ED with weakness, vomiting, diarrhea, and epigastric  discomfort moving up into his chest for the past week.  Patient's presentation is most consistent with acute presentation with potential threat to life or bodily function.  Differential diagnosis includes, but is not limited to, gastroenteritis, dehydration, electrolyte abnormality, AKI, GI bleed, gastritis, PUD, hepatitis, pancreatitis, cholecystitis, biliary colic, ACS.  Patient nontoxic-appearing and in no acute distress, vital signs remarkable for hypertension but otherwise reassuring.  EKG shows no evidence of arrhythmia or ischemia, troponin within normal limits and symptoms seem atypical for ACS.  I would be more suspicious for gastroenteritis with some GERD given pain moving up into his chest from his epigastrium.  He does have a benign abdominal exam and with symptoms seeming consistent with gastroenteritis, we will hold off on CT imaging.  Labs with leukocytosis and polycythemia, which patient does have a history of and there is likely some hemoconcentration worsening this today.  No significant electrolyte abnormality or AKI, LFTs are pending but lipase within normal limits.  Chest x-ray is unremarkable, patient did complain of some numbness and tingling in his left arm but CT head is negative.  Low suspicion for stroke given nonfocal neurologic exam.  We will treat symptomatically with IV fluids and IV Zofran, also give GI cocktail and reassess.  Patient reports symptoms much improved following Zofran and GI cocktail, he is now tolerating oral intake without difficulty.  He did develop red, raised, and itchy rash across both arms which appears to be an allergic reaction from unclear source.  No symptoms concerning for anaphylaxis and rash resolved following IV Benadryl.  Patient appropriate for outpatient management, will prescribe Zofran and he was counseled to follow-up with PCP, otherwise return to the ED for new or worsening symptoms.  Patient agrees with plan.      FINAL CLINICAL  IMPRESSION(S) / ED DIAGNOSES   Final diagnoses:  Gastroenteritis  Epigastric pain  Chest pain, unspecified type     Rx / DC Orders   ED Discharge Orders          Ordered    ondansetron (ZOFRAN-ODT) 4 MG disintegrating tablet  Every 8 hours PRN        07/03/23 1804    pantoprazole (PROTONIX) 40 MG tablet  Daily        07/03/23 1804             Note:  This document was prepared using Dragon voice recognition software and may include unintentional dictation errors.   Chesley Noon, MD 07/03/23 [phone]

## 2023-07-03 NOTE — ED Triage Notes (Signed)
Pt here with weakness and numbness that started yesterday evening. Pt has hx of 4 MI's and 6 stents. Pt has been vomiting and not able to sleep since pain yesterday. Pt states cp is centered and is worse with movement. Pt states he has a constant ache in his abd.

## 2023-07-03 NOTE — ED Notes (Addendum)
Pt called RN into room for itching and small red bumps appearing. Redness all over body with raised bumps. Chris Buttery, MD ordered 50mg  Benadryl. Patient is not in any respiratory distress. Patent has patent airway.

## 2023-07-06 ENCOUNTER — Encounter: Payer: Self-pay | Admitting: Oncology

## 2024-01-02 ENCOUNTER — Telehealth: Payer: Self-pay | Admitting: *Deleted

## 2024-01-02 NOTE — Telephone Encounter (Addendum)
 The dentist called today and wants to get dental treatment and she needs the fax number [phone]. I am unsure that we need to this . The patient last time he came here was 09/20/20. I am waiting to see [name] thinks

## 2024-01-02 NOTE — Telephone Encounter (Signed)
 Cancel the telephone - I accidental put in another phone call

## 2024-01-02 NOTE — Telephone Encounter (Signed)
 I am not seeing anymore and I can't send any forms to anyone. They need to reach out to his pcp

## 2024-01-18 ENCOUNTER — Telehealth: Payer: Self-pay | Admitting: Oncology

## 2024-01-18 NOTE — Telephone Encounter (Signed)
 Patient called and requested to schedule a follow up appointment with Dr Randy Buttery. PCP referral from March 2025 with labs available for review under media tab. Please advise.

## 2024-01-18 NOTE — Telephone Encounter (Signed)
 This will be a new heme since > 3 years. Next Wednesday. Labs I will decide after I see him

## 2024-01-23 ENCOUNTER — Inpatient Hospital Stay

## 2024-01-23 ENCOUNTER — Inpatient Hospital Stay: Attending: Oncology | Admitting: Oncology

## 2024-01-23 ENCOUNTER — Encounter: Payer: Self-pay | Admitting: Oncology

## 2024-01-23 VITALS — BP 115/77 | HR 80 | Temp 97.2°F | Resp 16 | Wt 195.0 lb

## 2024-01-23 VITALS — BP 122/81 | HR 75 | Resp 16

## 2024-01-23 DIAGNOSIS — G8929 Other chronic pain: Secondary | ICD-10-CM | POA: Insufficient documentation

## 2024-01-23 DIAGNOSIS — D751 Secondary polycythemia: Secondary | ICD-10-CM

## 2024-01-23 DIAGNOSIS — E291 Testicular hypofunction: Secondary | ICD-10-CM | POA: Insufficient documentation

## 2024-01-23 DIAGNOSIS — F17290 Nicotine dependence, other tobacco product, uncomplicated: Secondary | ICD-10-CM | POA: Insufficient documentation

## 2024-01-23 DIAGNOSIS — Z79899 Other long term (current) drug therapy: Secondary | ICD-10-CM | POA: Diagnosis not present

## 2024-01-23 LAB — COMPREHENSIVE METABOLIC PANEL WITH GFR
ALT: 21 U/L (ref 0–44)
AST: 19 U/L (ref 15–41)
Albumin: 3.7 g/dL (ref 3.5–5.0)
Alkaline Phosphatase: 73 U/L (ref 38–126)
Anion gap: 8 (ref 5–15)
BUN: 14 mg/dL (ref 6–20)
CO2: 20 mmol/L — ABNORMAL LOW (ref 22–32)
Calcium: 8.6 mg/dL — ABNORMAL LOW (ref 8.9–10.3)
Chloride: 107 mmol/L (ref 98–111)
Creatinine, Ser: 0.79 mg/dL (ref 0.61–1.24)
GFR, Estimated: >60 mL/min (ref >60)
Glucose, Bld: 257 mg/dL — ABNORMAL HIGH (ref 70–99)
Potassium: 3.9 mmol/L (ref 3.5–5.1)
Sodium: 135 mmol/L (ref 135–145)
Total Bilirubin: 0.6 mg/dL (ref 0.0–1.2)
Total Protein: 6.8 g/dL (ref 6.5–8.1)

## 2024-01-23 LAB — CBC WITH DIFFERENTIAL/PLATELET
Abs Immature Granulocytes: 0.03 10*3/uL (ref 0.00–0.07)
Basophils Absolute: 0.1 10*3/uL (ref 0.0–0.1)
Basophils Relative: 1 %
Eosinophils Absolute: 0.6 10*3/uL — ABNORMAL HIGH (ref 0.0–0.5)
Eosinophils Relative: 7 %
HCT: 56 % — ABNORMAL HIGH (ref 39.0–52.0)
Hemoglobin: 18.9 g/dL — ABNORMAL HIGH (ref 13.0–17.0)
Immature Granulocytes: 0 %
Lymphocytes Relative: 26 %
Lymphs Abs: 2.1 10*3/uL (ref 0.7–4.0)
MCH: 28.6 pg (ref 26.0–34.0)
MCHC: 33.8 g/dL (ref 30.0–36.0)
MCV: 84.7 fL (ref 80.0–100.0)
Monocytes Absolute: 0.7 10*3/uL (ref 0.1–1.0)
Monocytes Relative: 8 %
Neutro Abs: 4.8 10*3/uL (ref 1.7–7.7)
Neutrophils Relative %: 58 %
Platelets: 242 10*3/uL (ref 150–400)
RBC: 6.61 MIL/uL — ABNORMAL HIGH (ref 4.22–5.81)
RDW: 19.2 % — ABNORMAL HIGH (ref 11.5–15.5)
WBC: 8.2 10*3/uL (ref 4.0–10.5)
nRBC: 0 % (ref 0.0–0.2)

## 2024-01-23 LAB — URINALYSIS, COMPLETE (UACMP) WITH MICROSCOPIC
Bacteria, UA: NONE SEEN
Bilirubin Urine: NEGATIVE
Glucose, UA: >=500 mg/dL — AB
Hgb urine dipstick: NEGATIVE
Ketones, ur: NEGATIVE mg/dL
Leukocytes,Ua: NEGATIVE
Nitrite: NEGATIVE
Protein, ur: NEGATIVE mg/dL
RBC / HPF: 0 RBC/hpf (ref 0–5)
Specific Gravity, Urine: 1.027 (ref 1.005–1.030)
Squamous Epithelial / HPF: 0 /HPF (ref 0–5)
pH: 5 (ref 5.0–8.0)

## 2024-01-23 NOTE — Patient Instructions (Signed)

## 2024-01-23 NOTE — Progress Notes (Signed)
 Hematology/Oncology Consult note [facility] [phone] [phone]  Patient Care Team: System, Provider Not In as PCP - General Cara Chancellor Wiliam Harder, MD as PCP - Cardiology (Cardiology)   Name of the patient: Chris Norris  [id number]  Oct 11, 1960    Reason for referral-polycythemia   Referring physician-Cheryl Rozetta Corns, FNP  Date of visit: 01/23/24   History of presenting illness- Patient is a 63 year old male was seen by me back in 2021 when he was referred for polycythemia which was attributed to testosterone  injections.  Back then his testosterone  levels were elevated at 1176 and hemoglobin was around 19.  He was giving himself testosterone  injections for fatigue and for building muscle.  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  He has now been referred for the same complaints  Labs from March 2025 showed testosterone  level of 371, normal folate and B12 levels.  Normal TSH.  Iron saturation was within normal limits hemoglobin A1c elevated at 9.5 H&H of 19.6/59.6 with a white count of 8.3 and a platelet count of 259.  Patient states that after he saw me in 2021 he went off testosterone  supplementation but felt that his energy levels were dropping significantly and his primary care provider apparently checked his testosterone  levels which were found to be very low and therefore patient was restarted on testosterone  replacement therapy about 2 years ago.  States that he was on testosterone  pellets every 6 months and while he was on that treatment he did not have any polycythemia.  He was switched from testosterone  pellets to weekly testosterone  injections about a month and a half ago and that is when his hemoglobin started to rise.  Patient states that he does not use any exogenous testosterone  from other sources.  He has chronic neck pain and back pain  from recent surgeries.  ECOG PS- 1  Pain scale- 3   Review of systems- Review of Systems  Constitutional:  Positive for malaise/fatigue. Negative for chills, fever and weight loss.  HENT:  Negative for congestion, ear discharge and nosebleeds.   Eyes:  Negative for blurred vision.  Respiratory:  Negative for cough, hemoptysis, sputum production, shortness of breath and wheezing.   Cardiovascular:  Negative for chest pain, palpitations, orthopnea and claudication.  Gastrointestinal:  Negative for abdominal pain, blood in stool, constipation, diarrhea, heartburn, melena, nausea and vomiting.  Genitourinary:  Negative for dysuria, flank pain, frequency, hematuria and urgency.  Musculoskeletal:  Positive for back pain. Negative for joint pain and myalgias.  Skin:  Negative for rash.  Neurological:  Negative for dizziness, tingling, focal weakness, seizures, weakness and headaches.  Endo/Heme/Allergies:  Does not bruise/bleed easily.  Psychiatric/Behavioral:  Negative for depression and suicidal ideas. The patient does not have insomnia.     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/28/2017   Polycythemia, secondary 02/15/2017   Anxiety 08/11/2014   Coronary artery disease 08/11/2014     Past Medical History:  Diagnosis Date   Coronary artery disease    Diabetes mellitus without complication (HCC)    Heart disease    Myocardial infarction (HCC)    X 3 STENTS LAST 10 YEARS AGO   Sleep apnea in adult    Type 2 diabetes mellitus without  complication, with long-term current use of insulin  (HCC) 07/10/2019     Past Surgical History:  Procedure Laterality Date   BICEPS TENDON REPAIR     BICEPS TENDON REPAIR Right 11/28/2018   Procedure: OPEN SUBPECTORAL REPAIR OF THE LONG HEAD OF BICEP TENDON-RIGHT SHOULDER;  Surgeon: Elner Hahn, MD;  Location: [facility] ORS;  Service: Orthopedics;  Laterality: Right;   CORONARY ANGIOPLASTY     X 3   CORONARY STENT INTERVENTION N/A 07/09/2019   Procedure: CORONARY STENT INTERVENTION;  Surgeon: Wenona Hamilton, MD;  Location: [facility] INVASIVE CV LAB;  Service: Cardiovascular;  Laterality: N/A;  Distal RCA, Prox RCA, 1st Diag   KNEE ARTHROSCOPY W/ MENISCAL REPAIR     LEFT HEART CATH AND CORONARY ANGIOGRAPHY N/A 07/07/2019   Procedure: LEFT HEART CATH AND CORONARY ANGIOGRAPHY with cors and possible pci and stent;  Surgeon: Antonette Batters, MD;  Location: [facility] INVASIVE CV LAB;  Service: Cardiovascular;  Laterality: N/A;   LEFT HEART CATH AND CORONARY ANGIOGRAPHY N/A 12/09/2021   Procedure: LEFT HEART CATH AND CORONARY ANGIOGRAPHY;  Surgeon: Michelle Aid, MD;  Location: [facility] INVASIVE CV LAB;  Service: Cardiovascular;  Laterality: N/A;   STENT PLACEMENT VASCULAR ([facility] HX)     X 3 LAST 10 YEARS AGO   TRICEPS TENDON REPAIR      Social History   Socioeconomic History   Marital status: Single    Spouse name: Not on file   Number of children: Not on file   Years of education: Not on file   Highest education level: Not on file  Occupational History   Not on file  Tobacco Use   Smoking status: Never   Smokeless tobacco: Never   Tobacco comments:    occasional cigars  Vaping Use   Vaping status: Never Used  Substance and Sexual Activity   Alcohol use: Not Currently   Drug use: Never   Sexual activity: Yes    Birth control/protection: None  Other Topics Concern   Not on file  Social History Narrative   Not on file   Social Drivers of Health   Financial Resource Strain: Low Risk   (07/09/2023)   [facility]   Overall Financial Resource Strain (CARDIA)    Difficulty of Paying Living Expenses: Not hard at all  Food Insecurity: No Food Insecurity (07/09/2023)   Received from [facility]   Hunger Vital Sign    Worried About Running Out of Food in the Last Year: Never true    Ran Out of Food in the Last Year: Never true  Transportation Needs: Unknown (07/09/2023)   Received from [facility] - Transportation    In the past 12 months, has lack of transportation kept you from medical appointments or from getting medications?: No    Lack of Transportation (Non-Medical): Not on file  Physical Activity: Not on file  Stress: Not on file  Social Connections: Not on file  Intimate Partner Violence: Not on file     History reviewed. No pertinent family history.   Current Outpatient Medications:    acetaminophen  (TYLENOL ) 325 MG tablet, Take 2 tablets (650 mg  total) by mouth every 4 (four) hours as needed for headache or mild pain., Disp:  , Rfl:    aspirin  EC 81 MG tablet, Take 1 tablet (81 mg total) by mouth daily., Disp: , Rfl:    clopidogrel  (PLAVIX ) 75 MG tablet, TAKE 1 TABLET (75 MG TOTAL) BY MOUTH DAILY WITH BREAKFAST., Disp: 90 tablet, Rfl: 0   dicyclomine  (BENTYL ) 20 MG tablet, Take 1 tablet (20 mg total) by mouth 2 (two) times daily., Disp: 20 tablet, Rfl: 0   insulin  aspart (FIASP  FLEXTOUCH) 100 UNIT/ML FlexTouch Pen, INJECTABLE FOLLOW SAME DIRECTIONS GIVEN ON NOVOLOG ., Disp: , Rfl:    metoprolol  succinate (TOPROL -XL) 25 MG 24 hr tablet, Take 1 tablet (25 mg total) by mouth daily., Disp: 30 tablet, Rfl: 2   nitroGLYCERIN  (NITROSTAT ) 0.4 MG SL tablet, Place 1 tablet (0.4 mg total) under the tongue every 5 (five) minutes as needed for chest pain., Disp: 25 tablet, Rfl: 4   ondansetron  (ZOFRAN -ODT) 4 MG disintegrating tablet, Take 1 tablet (4 mg total) by mouth every 8 (eight) hours as needed  for nausea or vomiting., Disp: 12 tablet, Rfl: 0   REPATHA SURECLICK 140 MG/ML SOAJ, Inject 140 mg into the skin every 14 (fourteen) days., Disp: , Rfl:    TOUJEO  SOLOSTAR 300 UNIT/ML Solostar Pen, Inject 10 Units into the skin 2 (two) times daily., Disp: , Rfl:    Diclofenac Sodium CR 100 MG 24 hr tablet, Take 100 mg by mouth daily as needed. (Patient not taking: Reported on 01/23/2024), Disp: , Rfl:    gabapentin  (NEURONTIN ) 100 MG capsule, Take 2 capsules (200 mg total) by mouth 3 (three) times daily., Disp: 180 capsule, Rfl: 0   isosorbide  mononitrate (IMDUR ) 30 MG 24 hr tablet, Take 1 tablet (30 mg total) by mouth daily., Disp: 30 tablet, Rfl: 2   pantoprazole  (PROTONIX ) 40 MG tablet, Take 1 tablet (40 mg total) by mouth daily for 14 days., Disp: 14 tablet, Rfl: 0   traZODone  (DESYREL ) 50 MG tablet, Take 1 tablet (50 mg total) by mouth at bedtime as needed for sleep., Disp: 30 tablet, Rfl: 0   Physical exam: There were no vitals filed for this visit. Physical Exam Cardiovascular:     Rate and Rhythm: Normal rate and regular rhythm.     Heart sounds: Normal heart sounds.  Pulmonary:     Effort: Pulmonary effort is normal.     Breath sounds: Normal breath sounds.  Abdominal:     General: Bowel sounds are normal.     Palpations: Abdomen is soft.  Skin:    General: Skin is warm and dry.  Neurological:     Mental Status: He is alert and oriented to person, place, and time.           Latest Ref Rng & Units 07/03/2023   10:18 AM  CMP  Glucose 70 - 99 mg/dL 161   BUN 6 - 20 mg/dL 16   Creatinine 0.96 - 1.24 mg/dL 0.45   Sodium 409 - 811 mmol/L 133   Potassium 3.5 - 5.1 mmol/L 3.9   Chloride 98 - 111 mmol/L 98   CO2 22 - 32 mmol/L 23   Calcium 8.9 - 10.3 mg/dL 9.0   Total Protein 6.5 - 8.1 g/dL 7.4   Total Bilirubin 0.3 - 1.2 mg/dL 0.9   Alkaline Phos 38 - 126 U/L 99   AST 15 - 41 U/L 26   ALT 0 - 44 U/L 39  Latest Ref Rng & Units 07/03/2023   10:18 AM  CBC  WBC 4.0  - 10.5 K/uL 13.9   Hemoglobin 13.0 - 17.0 g/dL 11.9   Hematocrit 14.7 - 52.0 % 54.7   Platelets 150 - 400 K/uL 327     Assessment and plan- Patient is a 63 y.o. male referred for polycythemia  Discussed differences between primary polycythemia vera and secondary polycythemia.  Suspect patient has secondary polycythemia related to testosterone  replacement therapy.  I will however rule out polycythemia vera yet again since his last lab was done 4 years ago.  I plan to check JAK2, CALR, MPL, exon 12 and EPO levels today.  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  Patient states that he has not used testosterone  injections for the last 3 weeks and hopes to transition back to testosterone  pellets which he was using every 6 months and did not lead to problem of secondary polycythemia.  We will plan on performing 1 session of phlebotomy today with 350 cc to be taken out.  I will see him back in 2 weeks to discuss the results of labs and he will undergo his second session of lobotomy at that time.  Plan is to keep his hematocrit less than 50 while he can be continued on lower doses of testosterone  replacement therapy   Thank you for this kind referral and the opportunity to participate in the care of this patient   Visit Diagnosis 1. Polycythemia     Dr. Seretha Dance, MD, MPH [facility] [phone] 01/23/2024

## 2024-01-23 NOTE — Progress Notes (Signed)
 Chris Norris presents today for phlebotomy per MD orders. Phlebotomy procedure started at 1500 and ended at 1510. 400 mls removed. Patient tolerated procedure well. IV needle removed intact.

## 2024-01-24 LAB — TESTOSTERONE: Testosterone: 376 ng/dL (ref 264–916)

## 2024-01-24 LAB — ERYTHROPOIETIN: Erythropoietin: 11.4 m[IU]/mL (ref 2.6–18.5)

## 2024-01-31 LAB — CALR +MPL + E12-E15  (REFLEX)

## 2024-01-31 LAB — JAK2 V617F RFX CALR/MPL/E12-15

## 2024-02-06 ENCOUNTER — Inpatient Hospital Stay: Attending: Oncology

## 2024-02-06 ENCOUNTER — Inpatient Hospital Stay (HOSPITAL_BASED_OUTPATIENT_CLINIC_OR_DEPARTMENT_OTHER): Admitting: Oncology

## 2024-02-06 ENCOUNTER — Other Ambulatory Visit: Payer: Self-pay

## 2024-02-06 ENCOUNTER — Inpatient Hospital Stay

## 2024-02-06 VITALS — BP 120/79 | HR 73 | Resp 16

## 2024-02-06 DIAGNOSIS — D751 Secondary polycythemia: Secondary | ICD-10-CM

## 2024-02-06 LAB — HEMOGLOBIN AND HEMATOCRIT (CANCER CENTER ONLY)
HCT: 52.2 % — ABNORMAL HIGH (ref 39.0–52.0)
Hemoglobin: 17.8 g/dL — ABNORMAL HIGH (ref 13.0–17.0)

## 2024-02-06 NOTE — Progress Notes (Signed)
 Chris Norris presents today for phlebotomy per MD orders. Phlebotomy procedure started at 1410 and ended at 1430 350.mls removed. Patient tolerated procedure well. IV needle removed intact.

## 2024-02-06 NOTE — Progress Notes (Unsigned)
 Had a stomach "intestinal bacterial thing" for one week; N/V, diarrhea and constipation resolved.  Ongoing trouble sleeping.  Joint pain ranges 7-10 daily; states right upper back has been hurting quite a bit lately. States he takes Gabapentin  for pain but not everyday.

## 2024-02-06 NOTE — Patient Instructions (Signed)

## 2024-02-07 ENCOUNTER — Encounter: Payer: Self-pay | Admitting: Oncology

## 2024-02-07 NOTE — Progress Notes (Signed)
 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  Back then his testosterone  levels were elevated at 1176 and hemoglobin was around 19.  He was giving himself testosterone  injections for fatigue and for building muscle.  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  He has now been referred for the same complaints   Labs from March 2025 showed testosterone  level of 371, normal folate and B12 levels.  Normal TSH.  Iron saturation was within normal limits hemoglobin A1c elevated at 9.5 H&H of 19.6/59.6 with a white count of 8.3 and a platelet count of 259.   Patient states that after he saw me in 2021 he went off testosterone  supplementation but felt that his energy levels were dropping significantly and his primary care provider apparently checked his testosterone  levels which were found to be very low and therefore patient was restarted on testosterone  replacement therapy about 2 years ago.  States that he was on testosterone  pellets every 6 months and while he was on that treatment he did not have any polycythemia.  He was switched from testosterone  pellets to weekly testosterone  injections about a month and a half ago and that is when his hemoglobin started to  rise.  Patient states that he does not use any exogenous testosterone  from other sources.  He has chronic neck pain and back pain from recent surgeries.    Interval history- Patient is not presently taking his testosterone  injections. He is contemplating to back to testosterone  pellets or not  ECOG PS- 0 Pain scale- 0  Review of systems- Review of Systems  Constitutional:  Positive for malaise/fatigue. Negative for chills, fever and weight loss.  HENT:  Negative for congestion, ear discharge and nosebleeds.   Eyes:  Negative for blurred vision.  Respiratory:  Negative for cough, hemoptysis, sputum production, shortness of breath and wheezing.   Cardiovascular:  Negative for chest pain, palpitations, orthopnea and claudication.  Gastrointestinal:  Negative for abdominal pain, blood in stool, constipation, diarrhea, heartburn, melena, nausea and vomiting.  Genitourinary:  Negative for dysuria, flank pain, frequency, hematuria and urgency.  Musculoskeletal:  Negative for back pain, joint pain and myalgias.  Skin:  Negative for rash.  Neurological:  Negative for dizziness, tingling, focal weakness, seizures, weakness and headaches.  Endo/Heme/Allergies:  Does not bruise/bleed easily.  Psychiatric/Behavioral:  Negative for depression and suicidal ideas. The patient does not have insomnia.       No Known Allergies   Past Medical History:  Diagnosis Date   Coronary artery disease    Diabetes mellitus without complication (HCC)    Heart disease    Myocardial infarction (HCC)    X 3 STENTS LAST 10 YEARS AGO   Sleep apnea in adult    Type 2 diabetes mellitus without complication, with long-term current use of  insulin  (HCC) 07/10/2019     Past Surgical History:  Procedure Laterality Date   BICEPS TENDON REPAIR     BICEPS TENDON REPAIR Right 11/28/2018   Procedure: OPEN SUBPECTORAL REPAIR OF THE LONG HEAD OF BICEP TENDON-RIGHT SHOULDER;  Surgeon: Elner Hahn, MD;  Location: [facility] ORS;   Service: Orthopedics;  Laterality: Right;   CORONARY ANGIOPLASTY     X 3   CORONARY STENT INTERVENTION N/A 07/09/2019   Procedure: CORONARY STENT INTERVENTION;  Surgeon: Wenona Hamilton, MD;  Location: [facility];  Service: Cardiovascular;  Laterality: N/A;  Distal RCA, Prox RCA, 1st Diag   KNEE ARTHROSCOPY W/ MENISCAL REPAIR     LEFT HEART CATH AND CORONARY ANGIOGRAPHY N/A 07/07/2019   Procedure: LEFT HEART CATH AND CORONARY ANGIOGRAPHY with cors and possible pci and stent;  Surgeon: Antonette Batters, MD;  Location: [facility] INVASIVE CV LAB;  Service: Cardiovascular;  Laterality: N/A;   LEFT HEART CATH AND CORONARY ANGIOGRAPHY N/A 12/09/2021   Procedure: LEFT HEART CATH AND CORONARY ANGIOGRAPHY;  Surgeon: Michelle Aid, MD;  Location: [facility] INVASIVE CV LAB;  Service: Cardiovascular;  Laterality: N/A;   STENT PLACEMENT VASCULAR ([facility] HX)     X 3 LAST 10 YEARS AGO   TRICEPS TENDON REPAIR      Social History   Socioeconomic History   Marital status: Single    Spouse name: Not on file   Number of children: Not on file   Years of education: Not on file   Highest education level: Not on file  Occupational History   Not on file  Tobacco Use   Smoking status: Never   Smokeless tobacco: Never   Tobacco comments:    occasional cigars  Vaping Use   Vaping status: Never Used  Substance and Sexual Activity   Alcohol use: Not Currently   Drug use: Never   Sexual activity: Yes    Birth control/protection: None  Other Topics Concern   Not on file  Social History Narrative   Not on file   Social Drivers of Health   Financial Resource Strain: Low Risk  (07/09/2023)   Received from [facility]   Overall Financial Resource Strain (CARDIA)    Difficulty of Paying Living Expenses: Not hard at all  Food Insecurity: No Food Insecurity (01/23/2024)   Hunger Vital Sign    Worried About Running Out of Food in the Last Year: Never true    Ran Out of Food in the Last  Year: Never true  Transportation Needs: No Transportation Needs (01/23/2024)   PRAPARE - Administrator, Civil Service (Medical): No    Lack of Transportation (Non-Medical): No  Physical Activity: Not on file  Stress: Not on file  Social Connections: Not on file  Intimate Partner Violence: Not At Risk (01/23/2024)   Humiliation, Afraid, Rape, and Kick questionnaire    Fear of Current or Ex-Partner: No    Emotionally Abused: No    Physically Abused: No    Sexually Abused: No    No family history on file.   Current Outpatient Medications:    acetaminophen  (TYLENOL ) 325 MG tablet, Take 2 tablets (650 mg total) by mouth every 4 (four) hours as needed for headache or mild pain., Disp:  , Rfl:    albuterol (VENTOLIN HFA) 108 (90 Base) MCG/ACT inhaler, 2 puffs., Disp: , Rfl:    aspirin  EC 81 MG tablet, Take 1 tablet (81 mg total) by mouth daily., Disp: ,  Rfl:    clopidogrel  (PLAVIX ) 75 MG tablet, TAKE 1 TABLET (75 MG TOTAL) BY MOUTH DAILY WITH BREAKFAST., Disp: 90 tablet, Rfl: 0   Diclofenac Sodium CR 100 MG 24 hr tablet, Take 100 mg by mouth daily as needed., Disp: , Rfl:    ezetimibe  (ZETIA ) 10 MG tablet, Take 10 mg by mouth daily., Disp: , Rfl:    insulin  aspart (FIASP  FLEXTOUCH) 100 UNIT/ML FlexTouch Pen, INJECTABLE FOLLOW SAME DIRECTIONS GIVEN ON NOVOLOG ., Disp: , Rfl:    Insulin  Degludec (TRESIBA FLEXTOUCH Gross), Inject into the skin., Disp: , Rfl:    nitroGLYCERIN  (NITROSTAT ) 0.4 MG SL tablet, Place 1 tablet (0.4 mg total) under the tongue every 5 (five) minutes as needed for chest pain., Disp: 25 tablet, Rfl: 4   Omega-3 Fatty Acids (FISH OIL) 300 MG CAPS, Take by mouth., Disp: , Rfl:    ondansetron  (ZOFRAN -ODT) 4 MG disintegrating tablet, Take 1 tablet (4 mg total) by mouth every 8 (eight) hours as needed for nausea or vomiting., Disp: 12 tablet, Rfl: 0   REPATHA SURECLICK 140 MG/ML SOAJ, Inject 140 mg into the skin every 14 (fourteen) days., Disp: , Rfl:    dicyclomine   (BENTYL ) 20 MG tablet, Take 1 tablet (20 mg total) by mouth 2 (two) times daily. (Patient not taking: Reported on 02/06/2024), Disp: 20 tablet, Rfl: 0   gabapentin  (NEURONTIN ) 100 MG capsule, Take 2 capsules (200 mg total) by mouth 3 (three) times daily., Disp: 180 capsule, Rfl: 0   isosorbide  mononitrate (IMDUR ) 30 MG 24 hr tablet, Take 1 tablet (30 mg total) by mouth daily., Disp: 30 tablet, Rfl: 2   metoprolol  succinate (TOPROL -XL) 25 MG 24 hr tablet, Take 1 tablet (25 mg total) by mouth daily., Disp: 30 tablet, Rfl: 2   pantoprazole  (PROTONIX ) 40 MG tablet, Take 1 tablet (40 mg total) by mouth daily for 14 days., Disp: 14 tablet, Rfl: 0   TOUJEO  SOLOSTAR 300 UNIT/ML Solostar Pen, Inject 10 Units into the skin 2 (two) times daily. (Patient not taking: Reported on 02/06/2024), Disp: , Rfl:    traZODone  (DESYREL ) 50 MG tablet, Take 1 tablet (50 mg total) by mouth at bedtime as needed for sleep., Disp: 30 tablet, Rfl: 0  Physical exam:  Vitals:   02/06/24 1339  BP: 111/72  Pulse: 78  Resp: 16  Temp: (!) 97.2 F (36.2 C)  SpO2: 97%  Weight: 199 lb 14.4 oz (90.7 kg)  Height: 5\' 10"  (1.778 m)   Physical Exam Cardiovascular:     Rate and Rhythm: Normal rate and regular rhythm.     Heart sounds: Normal heart sounds.  Pulmonary:     Effort: Pulmonary effort is normal.     Breath sounds: Normal breath sounds.  Skin:    General: Skin is warm and dry.  Neurological:     Mental Status: He is alert and oriented to person, place, and time.      I have personally reviewed labs listed below:    Latest Ref Rng & Units 01/23/2024    2:25 PM  CMP  Glucose 70 - 99 mg/dL 161   BUN 6 - 20 mg/dL 14   Creatinine 0.96 - 1.24 mg/dL 0.45   Sodium 409 - 811 mmol/L 135   Potassium 3.5 - 5.1 mmol/L 3.9   Chloride 98 - 111 mmol/L 107   CO2 22 - 32 mmol/L 20   Calcium 8.9 - 10.3 mg/dL 8.6   Total Protein 6.5 - 8.1 g/dL 6.8  Total Bilirubin 0.0 - 1.2 mg/dL 0.6   Alkaline Phos 38 - 126 U/L 73   AST  15 - 41 U/L 19   ALT 0 - 44 U/L 21       Latest Ref Rng & Units 02/06/2024    1:06 PM  CBC  Hemoglobin 13.0 - 17.0 g/dL 30.8   Hematocrit 65.7 - 52.0 % 52.2      Assessment and plan- Patient is a 63 y.o. male here for f/u of secondary polycythemia  Discussed the results of polycythemia workup with the patient which again does not show any evidence of polycythemia vera.  JAK2, CALR, exon 12 mutations and MPL mutation negative.  EPO levels are normal.  Testosterone  levels are normal.  Patient is on testosterone  replacement therapy which is likely the cause of his polycythemia.  Patient states that polycythemia was not an issue when he was on testosterone  pellets but started becoming an issue with injectable testosterone  which she has been getting through his primary care provider for hypogonadism.  Goal would be to keep his hematocrit less than 50 while he is on testosterone  replacement therapy.  It would be ideal if patient can take the lowest doses of testosterone  but would avoid worsening his polycythemia requiring frequent phlebotomies.  He will proceed with 1 session of phlebotomy this week and in 1 week.  CBC and possible phlebotomy in 2 4 and 6 months and I will see him back in 6 months   Visit Diagnosis 1. Polycythemia, secondary      Dr. Seretha Dance, MD, MPH [facility] [phone] 02/07/2024 12:46 PM

## 2024-02-13 ENCOUNTER — Inpatient Hospital Stay

## 2024-02-13 DIAGNOSIS — D751 Secondary polycythemia: Secondary | ICD-10-CM

## 2024-02-13 LAB — HEMOGLOBIN AND HEMATOCRIT (CANCER CENTER ONLY)
HCT: 49.1 % (ref 39.0–52.0)
Hemoglobin: 17.1 g/dL — ABNORMAL HIGH (ref 13.0–17.0)

## 2024-02-13 NOTE — Progress Notes (Signed)
 No Phlebotomy today meets parameters Hct 49.1

## 2024-02-15 ENCOUNTER — Telehealth: Payer: Self-pay | Admitting: *Deleted

## 2024-02-15 NOTE — Telephone Encounter (Signed)
 No contraindication for dental work from hematology and polycythemia standpoint

## 2024-02-15 NOTE — Telephone Encounter (Signed)
 Patient called and said he needs a note for some dental work and they needed from Dr. Randy Buttery.I called him to ask about what dental issues he has.  I got his voicemail when I called and asked him if he can go up to the whole and what dentists do you use because we usually have to make a letter and said fax it over there to them.  I told him that he probably would not hear anything until Tuesday or Wednesday because of the more ill day holiday

## 2024-02-19 NOTE — Telephone Encounter (Signed)
 Per Dr. Randy Buttery "No contraindication for dental work from hematology and polycythemia standpoint "

## 2024-02-20 ENCOUNTER — Telehealth: Payer: Self-pay | Admitting: *Deleted

## 2024-02-20 ENCOUNTER — Encounter: Payer: Self-pay | Admitting: Oncology

## 2024-02-20 NOTE — Telephone Encounter (Signed)
 Called patient and instructed letter left at front desk for pick up.  Patient verbalized understanding.

## 2024-02-20 NOTE — Telephone Encounter (Signed)
 Called patient and stated that Dr Randy Buttery had noted that patient was cleared for dental procedure from hematology and polycythemia stand point.  Pt verbalized understanding but states he needs a note from Dr Randy Buttery stating the above to take to [facility].  Pt states he can pick up the note from clinic when it is ready.

## 2024-02-20 NOTE — Telephone Encounter (Addendum)
 Dental clearance letter provided to [facility].

## 2024-02-20 NOTE — Telephone Encounter (Signed)
 Provided dental clearance letter including the verbiage listed below to [facility].  [facility] agreed to kindly reach out to the patient and provide letter.  No further follow up needed.

## 2024-04-07 ENCOUNTER — Inpatient Hospital Stay: Attending: Oncology

## 2024-04-07 ENCOUNTER — Encounter

## 2024-06-09 ENCOUNTER — Inpatient Hospital Stay: Attending: Oncology

## 2024-06-09 ENCOUNTER — Inpatient Hospital Stay

## 2024-07-25 ENCOUNTER — Encounter: Payer: Self-pay | Admitting: Oncology

## 2024-08-08 ENCOUNTER — Inpatient Hospital Stay: Attending: Oncology

## 2024-08-08 ENCOUNTER — Inpatient Hospital Stay

## 2024-08-08 ENCOUNTER — Encounter: Payer: Self-pay | Admitting: Oncology

## 2024-08-08 ENCOUNTER — Inpatient Hospital Stay: Admitting: Oncology

## 2024-08-08 VITALS — BP 98/78 | HR 70

## 2024-08-08 VITALS — BP 116/77 | HR 69 | Temp 97.8°F | Resp 19 | Ht 70.0 in | Wt 199.7 lb

## 2024-08-08 DIAGNOSIS — D751 Secondary polycythemia: Secondary | ICD-10-CM | POA: Diagnosis not present

## 2024-08-08 LAB — HEMOGLOBIN AND HEMATOCRIT (CANCER CENTER ONLY)
HCT: 51 % (ref 39.0–52.0)
Hemoglobin: 17.8 g/dL — ABNORMAL HIGH (ref 13.0–17.0)

## 2024-08-08 NOTE — Progress Notes (Signed)
 Patient states his [birth year] is [birth year], whoop whoop!  He has been feeling tired & weak but has no other new or acute concerns.

## 2024-08-09 ENCOUNTER — Encounter: Payer: Self-pay | Admitting: Oncology

## 2024-08-09 NOTE — Progress Notes (Signed)
 Hematology/Oncology Consult note [facility]  [phone] [phone]  Patient Care Team: System, Provider Not In as PCP - General Fath, Vinie LABOR, MD as PCP - Cardiology (Cardiology) Melanee Annah BROCKS, MD as Consulting Physician (Hematology and Oncology)   Name of the patient: Chris Norris  [id number]  Jan 08, 1961   Date of visit: 08/09/24  Diagnosis- secondary polycythemia  Chief complaint/ Reason for visit- routine f/u of secondary polycythemia  Heme/Onc history: Patient is a 63 year old male was seen by me back in 2021 when he was referred for polycythemia which was attributed to testosterone  injections.  Back then his testosterone  levels were elevated at 1176 and hemoglobin was around 19.  He was giving himself testosterone  injections for fatigue and for building muscle.  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  He has now been referred for the same complaints   Labs from March 2025 showed testosterone  level of 371, normal folate and B12 levels.  Normal TSH.  Iron saturation was within normal limits hemoglobin A1c elevated at 9.5 H&H of 19.6/59.6 with a white count of 8.3 and a platelet count of 259.   Patient states that after he saw me in 2021 he went off testosterone  supplementation but felt that his energy levels were dropping significantly and his primary care provider apparently checked his testosterone  levels which were found to be very low and therefore patient was restarted on testosterone  replacement therapy about 2 years ago.  States that he was on testosterone  pellets every 6 months and while he was on that treatment he did not have any polycythemia.  He was switched from testosterone  pellets to weekly testosterone  injections about a month and a half ago and that is when his hemoglobin started to rise.  Patient states that he does not  use any exogenous testosterone  from other sources.  He has chronic neck pain and back pain from recent surgeries.    Interval history- He feels fatigued. Bowel movement patterns have been irregular and he is trying to get his colonoscopy done soon. Denies any blood in his stool. He gets testosterone  replacement therapy through his pcp for fatigue History of Present Illness   ECOG PS- 0 Pain scale- 0  Review of systems- Review of Systems  Constitutional:  Positive for malaise/fatigue. Negative for chills, fever and weight loss.  HENT:  Negative for congestion, ear discharge and nosebleeds.   Eyes:  Negative for blurred vision.  Respiratory:  Negative for cough, hemoptysis, sputum production, shortness of breath and wheezing.   Cardiovascular:  Negative for chest pain, palpitations, orthopnea and claudication.  Gastrointestinal:  Positive for constipation. Negative for abdominal pain, blood in stool, diarrhea, heartburn, melena, nausea and vomiting.  Genitourinary:  Negative for dysuria, flank pain, frequency, hematuria and urgency.  Musculoskeletal:  Negative for back pain, joint pain and myalgias.  Skin:  Negative for rash.  Neurological:  Negative for dizziness, tingling, focal weakness, seizures, weakness and headaches.  Endo/Heme/Allergies:  Does not bruise/bleed easily.  Psychiatric/Behavioral:  Negative for depression and suicidal ideas. The patient does not have insomnia.       Allergies  Allergen Reactions   Metformin Diarrhea     Past Medical History:  Diagnosis Date   Coronary artery disease    Diabetes mellitus without complication (HCC)    Heart disease    Myocardial infarction (HCC)    X  3 STENTS LAST 10 YEARS AGO   Sleep apnea in adult    Type 2 diabetes mellitus without complication, with long-term current use of insulin  (HCC) 07/10/2019     Past Surgical History:  Procedure Laterality Date   BICEPS TENDON REPAIR     BICEPS TENDON REPAIR Right 11/28/2018    Procedure: OPEN SUBPECTORAL REPAIR OF THE LONG HEAD OF BICEP TENDON-RIGHT SHOULDER;  Surgeon: Edie Norleen PARAS, MD;  Location: [facility] ORS;  Service: Orthopedics;  Laterality: Right;   CORONARY ANGIOPLASTY     X 3   CORONARY STENT INTERVENTION N/A 07/09/2019   Procedure: CORONARY STENT INTERVENTION;  Surgeon: Darron Deatrice LABOR, MD;  Location: [facility];  Service: Cardiovascular;  Laterality: N/A;  Distal RCA, Prox RCA, 1st Diag   KNEE ARTHROSCOPY W/ MENISCAL REPAIR     LEFT HEART CATH AND CORONARY ANGIOGRAPHY N/A 07/07/2019   Procedure: LEFT HEART CATH AND CORONARY ANGIOGRAPHY with cors and possible pci and stent;  Surgeon: Florencio Cara BIRCH, MD;  Location: [facility];  Service: Cardiovascular;  Laterality: N/A;   LEFT HEART CATH AND CORONARY ANGIOGRAPHY N/A 12/09/2021   Procedure: LEFT HEART CATH AND CORONARY ANGIOGRAPHY;  Surgeon: Hester Wolm PARAS, MD;  Location: [facility];  Service: Cardiovascular;  Laterality: N/A;   STENT PLACEMENT VASCULAR ([facility] HX)     X 3 LAST 10 YEARS AGO   TRICEPS TENDON REPAIR      Social History   Socioeconomic History   Marital status: Single    Spouse name: Not on file   Number of children: Not on file   Years of education: Not on file   Highest education level: Not on file  Occupational History   Not on file  Tobacco Use   Smoking status: Never   Smokeless tobacco: Never   Tobacco comments:    occasional cigars  Vaping Use   Vaping status: Never Used  Substance and Sexual Activity   Alcohol use: Not Currently   Drug use: Never   Sexual activity: Yes    Birth control/protection: None  Other Topics Concern   Not on file  Social History Narrative   Not on file   Social Drivers of Health   Financial Resource Strain: Low Risk  (07/09/2023)   Received from [facility]   Overall Financial Resource Strain (CARDIA)    Difficulty of Paying Living Expenses: Not hard at all  Food Insecurity: No Food  Insecurity (01/23/2024)   Hunger Vital Sign    Worried About Running Out of Food in the Last Year: Never true    Ran Out of Food in the Last Year: Never true  Transportation Needs: No Transportation Needs (01/23/2024)   PRAPARE - Administrator, Civil Service (Medical): No    Lack of Transportation (Non-Medical): No  Physical Activity: Not on file  Stress: Not on file  Social Connections: Not on file  Intimate Partner Violence: Not At Risk (01/23/2024)   Humiliation, Afraid, Rape, and Kick questionnaire    Fear of Current or Ex-Partner: No    Emotionally Abused: No    Physically Abused: No    Sexually Abused: No    History reviewed. No pertinent family history.   Current Outpatient Medications:    acetaminophen  (TYLENOL ) 325 MG tablet, Take 2 tablets (650 mg total) by mouth every 4 (four) hours as needed for headache or mild pain., Disp:  , Rfl:    albuterol (VENTOLIN HFA) 108 (  90 Base) MCG/ACT inhaler, 2 puffs., Disp: , Rfl:    aspirin  EC 81 MG tablet, Take 1 tablet (81 mg total) by mouth daily., Disp: , Rfl:    clopidogrel  (PLAVIX ) 75 MG tablet, TAKE 1 TABLET (75 MG TOTAL) BY MOUTH DAILY WITH BREAKFAST., Disp: 90 tablet, Rfl: 0   ezetimibe  (ZETIA ) 10 MG tablet, Take 10 mg by mouth daily., Disp: , Rfl:    gabapentin  (NEURONTIN ) 100 MG capsule, Take 2 capsules (200 mg total) by mouth 3 (three) times daily., Disp: 180 capsule, Rfl: 0   ibandronate (BONIVA) 150 MG tablet, Take 150 mg by mouth every 30 (thirty) days., Disp: , Rfl:    Insulin  Degludec (TRESIBA FLEXTOUCH [place]), Inject into the skin., Disp: , Rfl:    metoprolol  succinate (TOPROL -XL) 25 MG 24 hr tablet, Take 1 tablet (25 mg total) by mouth daily., Disp: 30 tablet, Rfl: 2   nitroGLYCERIN  (NITROSTAT ) 0.4 MG SL tablet, Place 1 tablet (0.4 mg total) under the tongue every 5 (five) minutes as needed for chest pain., Disp: 25 tablet, Rfl: 4   Omega-3 Fatty Acids (FISH OIL) 300 MG CAPS, Take by mouth., Disp: , Rfl:     ondansetron  (ZOFRAN -ODT) 4 MG disintegrating tablet, Take 1 tablet (4 mg total) by mouth every 8 (eight) hours as needed for nausea or vomiting., Disp: 12 tablet, Rfl: 0   pantoprazole  (PROTONIX ) 40 MG tablet, Take 1 tablet (40 mg total) by mouth daily for 14 days., Disp: 14 tablet, Rfl: 0   REPATHA SURECLICK 140 MG/ML SOAJ, Inject 140 mg into the skin every 14 (fourteen) days., Disp: , Rfl:    chlorhexidine  (PERIDEX) 0.12 % solution, 2 (two) times daily. (Patient not taking: Reported on 08/08/2024), Disp: , Rfl:    Diclofenac Sodium CR 100 MG 24 hr tablet, Take 100 mg by mouth daily as needed. (Patient not taking: Reported on 08/08/2024), Disp: , Rfl:    dicyclomine  (BENTYL ) 20 MG tablet, Take 1 tablet (20 mg total) by mouth 2 (two) times daily. (Patient not taking: Reported on 08/08/2024), Disp: 20 tablet, Rfl: 0   EMBECTA PEN NEEDLE NANO 2 GEN 32G X 4 MM MISC, SMARTSIG:1 pen needle Once a Week (Patient not taking: Reported on 08/08/2024), Disp: , Rfl:    insulin  aspart (FIASP  FLEXTOUCH) 100 UNIT/ML FlexTouch Pen, INJECTABLE FOLLOW SAME DIRECTIONS GIVEN ON NOVOLOG . (Patient not taking: Reported on 08/08/2024), Disp: , Rfl:    insulin  lispro (HUMALOG) 100 UNIT/ML KwikPen, Inject 0-10 Units into the skin. (Patient not taking: Reported on 08/08/2024), Disp: , Rfl:    isosorbide  mononitrate (IMDUR ) 30 MG 24 hr tablet, Take 1 tablet (30 mg total) by mouth daily. (Patient not taking: Reported on 08/08/2024), Disp: 30 tablet, Rfl: 2   TOUJEO  SOLOSTAR 300 UNIT/ML Solostar Pen, Inject 10 Units into the skin 2 (two) times daily. (Patient not taking: Reported on 08/08/2024), Disp: , Rfl:    traZODone  (DESYREL ) 50 MG tablet, Take 1 tablet (50 mg total) by mouth at bedtime as needed for sleep. (Patient not taking: Reported on 08/08/2024), Disp: 30 tablet, Rfl: 0  Physical exam:  Vitals:   08/08/24 1519  BP: 116/77  Pulse: 69  Resp: 19  Temp: 97.8 F (36.6 C)  TempSrc: Tympanic  SpO2: 96%  Weight: 199 lb  11.2 oz (90.6 kg)  Height: 5' 10 (1.778 m)   Physical Exam Cardiovascular:     Rate and Rhythm: Normal rate and regular rhythm.     Heart sounds: Normal heart sounds.  Pulmonary:  Effort: Pulmonary effort is normal.     Breath sounds: Normal breath sounds.  Musculoskeletal:     Cervical back: Normal range of motion.  Skin:    General: Skin is warm and dry.  Neurological:     Mental Status: He is alert and oriented to person, place, and time.      I have personally reviewed labs listed below:    Latest Ref Rng & Units 01/23/2024    2:25 PM  CMP  Glucose 70 - 99 mg/dL 742   BUN 6 - 20 mg/dL 14   Creatinine 9.38 - 1.24 mg/dL 9.20   Sodium 864 - 854 mmol/L 135   Potassium 3.5 - 5.1 mmol/L 3.9   Chloride 98 - 111 mmol/L 107   CO2 22 - 32 mmol/L 20   Calcium 8.9 - 10.3 mg/dL 8.6   Total Protein 6.5 - 8.1 g/dL 6.8   Total Bilirubin 0.0 - 1.2 mg/dL 0.6   Alkaline Phos 38 - 126 U/L 73   AST 15 - 41 U/L 19   ALT 0 - 44 U/L 21       Latest Ref Rng & Units 08/08/2024    2:34 PM  CBC  Hemoglobin 13.0 - 17.0 g/dL 82.1   Hematocrit 60.9 - 52.0 % 51.0    I have personally reviewed Radiology images listed below: No images are attached to the encounter.  No results found.   Assessment and plan- Patient is a 63 y.o. 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 Hct today is >50. Will proceed with phlebotomy today. Labs Q2 months and if hct >50 proceed with phlebotomy. I will see him back in 6 months   Visit Diagnosis 1. Polycythemia, secondary      Dr. Annah Skene, MD, MPH [facility] [phone] 08/09/2024 5:27 PM

## 2024-10-10 ENCOUNTER — Inpatient Hospital Stay

## 2024-10-10 ENCOUNTER — Inpatient Hospital Stay: Attending: Oncology

## 2024-12-12 ENCOUNTER — Inpatient Hospital Stay

## 2025-02-06 ENCOUNTER — Inpatient Hospital Stay: Admitting: Oncology

## 2025-02-06 ENCOUNTER — Inpatient Hospital Stay
